# Patient Record
Sex: Male | Born: 1945 | Race: White | Hispanic: No | Marital: Married | Smoking: Never smoker
Health system: Southern US, Community
[De-identification: ages and names within clinical notes are randomized; demographics above are authoritative.]

## PROBLEM LIST (undated history)

## (undated) DIAGNOSIS — E291 Testicular hypofunction: Secondary | ICD-10-CM

## (undated) DIAGNOSIS — Z9889 Other specified postprocedural states: Secondary | ICD-10-CM

## (undated) DIAGNOSIS — E079 Disorder of thyroid, unspecified: Secondary | ICD-10-CM

## (undated) DIAGNOSIS — D839 Common variable immunodeficiency, unspecified: Principal | ICD-10-CM

## (undated) DIAGNOSIS — I10 Essential (primary) hypertension: Secondary | ICD-10-CM

## (undated) HISTORY — PX: HERNIA REPAIR: SHX51

## (undated) HISTORY — DX: Essential (primary) hypertension: I10

## (undated) HISTORY — DX: Disorder of thyroid, unspecified: E07.9

## (undated) HISTORY — PX: APPENDECTOMY: SHX54

## (undated) HISTORY — DX: Other specified postprocedural states: Z98.890

## (undated) HISTORY — DX: Common variable immunodeficiency, unspecified: D83.9

## (undated) HISTORY — PX: OTHER SURGICAL HISTORY: SHX169

## (undated) HISTORY — PX: MENISCUS REPAIR: SHX5179

## (undated) HISTORY — DX: Testicular hypofunction: E29.1

---

## 2015-08-02 ENCOUNTER — Ambulatory Visit (HOSPITAL_COMMUNITY): Payer: Medicare Other | Admitting: Psychiatry

## 2015-08-02 DIAGNOSIS — F332 Major depressive disorder, recurrent severe without psychotic features: Secondary | ICD-10-CM | POA: Insufficient documentation

## 2015-08-02 NOTE — Progress Notes (Signed)
Psychiatric Initial Adult Assessment   Patient Identification: Leonard Reid MRN:  [id number] Date of Evaluation:  08/02/2015 Referral Source: self Chief Complaint:   Visit Diagnosis:    ICD-9-CM ICD-10-CM   1. Severe recurrent major depression without psychotic features [facility]) 296.33 F33.2    Diagnosis:   Patient Active Problem List   Diagnosis Date Noted  . Severe recurrent major depression without psychotic features (HCC) [F33.2] 08/02/2015    Priority: Medium    Class: Chronic   History of Present Illness:  Leonard Reid was diagnosed with depression in 1982 but recalls being depressed since the 70's.  He has has many rounds of medication treatment with augmentation and no significant response from any.  He has been in and out of therapy for years but the depression has persisted.  Currently he feels sad, somewhat hopeless, guilty for past decisions, has always had trouble sleeping, cannot find enjoyment, does not look forward to things and feels something has to change because he cannot continue living this way.  Childhood was uneventful without known history of depression in his family.  He remembers being happy as a child.  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  His second and current wife is supportive.  In spite of a basically good life on the outside he still feels depressed.  No significant anxiety until the last few years.  No psychosis with the depression.  The depression gets better and worse on an unpredictable cycle.  He says he feels more depressed than the depression scales reveal and he presents as severely depressed. Elements:  Location:  depression. Quality:  daily sadness. Severity:  no joy in life. Timing:  many years without known precipitant. Duration:  as above. Context:  as above. Associated  Signs/Symptoms: Depression Symptoms:  depressed mood, anhedonia, insomnia, fatigue, feelings of worthlessness/guilt, difficulty concentrating, hopelessness, impaired memory, anxiety, (Hypo) Manic Symptoms:  Irritable Mood, Anxiety Symptoms:  Excessive Worry, Psychotic Symptoms:  none PTSD Symptoms: Negative  Past Medical History: No past medical history on file. No past surgical history on file. Family History: No family history on file. Social History:   Social History   Social History  . Marital Status: Married    Spouse Name: N/A  . Number of Children: N/A  . Years of Education: N/A   Social History Main Topics  . Smoking status: Not on file  . Smokeless tobacco: Not on file  . Alcohol Use: Not on file  . Drug Use: Not on file  . Sexual Activity: Not on file   Other Topics Concern  . Not on file   Social History Narrative  . No narrative on file   Additional Social History: none  Musculoskeletal: Strength & Muscle Tone: within normal limits Gait & Station: normal Patient leans: N/A  Psychiatric Specialty Exam: HPI  ROS  There were no vitals taken for this visit.There is no height or weight on file to calculate BMI.  General Appearance: Well Groomed  Eye Contact:  Good  Speech:  Clear and Coherent  Volume:  Normal  Mood:  Anxious and Depressed  Affect:  Congruent  Thought Process:  Coherent and Logical  Orientation:  Full (Time, Place, and Person)  Thought Content:  Negative  Suicidal Thoughts:  No  Homicidal Thoughts:  No  Memory:  Immediate;   Good Recent;  Good Remote;   Good  Judgement:  Good  Insight:  Good  Psychomotor Activity:  Normal  Concentration:  Good  Recall:  Good  Fund of Knowledge:Good  Language: Good  Akathisia:  Negative  Handed:  Right  AIMS (if indicated):  0  Assets:  Communication Skills Desire for Improvement Financial Resources/Insurance Housing Intimacy Leisure Time Physical Health Social  Support Talents/Skills Transportation Vocational/Educational  ADL's:  Intact  Cognition: WNL  Sleep:  Insomnia all his adult life   Is the patient at risk to self?  No. Has the patient been a risk to self in the past 6 months?  No. Has the patient been a risk to self within the distant past?  No. Is the patient a risk to others?  No. Has the patient been a risk to others in the past 6 months?  No. Has the patient been a risk to others within the distant past?  No.  Allergies:  Not on File Current Medications: No current outpatient prescriptions on file.   No current facility-administered medications for this visit.    Previous Psychotropic Medications: Yes   Substance Abuse History in the last 12 months:  No.  Consequences of Substance Abuse: Negative  Medical Decision Making:  Established Problem, Worsening (2)  Treatment Plan Summary: qualifies for TMS treatment. no history of seizures, no metal implants, not bipolar and no psychosis    Carolanne GrumblingGerald Taylor 11/30/20162:22 PM

## 2015-10-10 ENCOUNTER — Ambulatory Visit (HOSPITAL_COMMUNITY): Payer: Medicare Other | Admitting: Psychiatry

## 2015-10-26 ENCOUNTER — Encounter: Payer: Self-pay | Admitting: Hematology and Oncology

## 2015-10-26 ENCOUNTER — Other Ambulatory Visit: Payer: Self-pay | Admitting: Hematology and Oncology

## 2015-10-26 DIAGNOSIS — D839 Common variable immunodeficiency, unspecified: Secondary | ICD-10-CM

## 2015-10-26 HISTORY — DX: Common variable immunodeficiency, unspecified: D83.9

## 2015-11-13 ENCOUNTER — Encounter: Payer: Self-pay | Admitting: Hematology and Oncology

## 2015-12-04 ENCOUNTER — Telehealth: Payer: Self-pay | Admitting: Hematology and Oncology

## 2015-12-04 ENCOUNTER — Other Ambulatory Visit: Payer: Self-pay | Admitting: Hematology and Oncology

## 2015-12-04 ENCOUNTER — Telehealth: Payer: Self-pay | Admitting: *Deleted

## 2015-12-04 ENCOUNTER — Other Ambulatory Visit (HOSPITAL_BASED_OUTPATIENT_CLINIC_OR_DEPARTMENT_OTHER): Payer: Medicare Other

## 2015-12-04 ENCOUNTER — Ambulatory Visit (HOSPITAL_BASED_OUTPATIENT_CLINIC_OR_DEPARTMENT_OTHER): Payer: Medicare Other | Admitting: Hematology and Oncology

## 2015-12-04 ENCOUNTER — Encounter: Payer: Self-pay | Admitting: Hematology and Oncology

## 2015-12-04 VITALS — BP 134/66 | HR 67 | Temp 97.8°F | Resp 19 | Ht 68.0 in | Wt 178.8 lb

## 2015-12-04 DIAGNOSIS — D839 Common variable immunodeficiency, unspecified: Secondary | ICD-10-CM

## 2015-12-04 LAB — COMPREHENSIVE METABOLIC PANEL
ALBUMIN: 3.5 g/dL (ref 3.5–5.0)
ALK PHOS: 110 U/L (ref 40–150)
ALT: 44 U/L (ref 0–55)
AST: 32 U/L (ref 5–34)
Anion Gap: 6 mEq/L (ref 3–11)
BUN: 22.9 mg/dL (ref 7.0–26.0)
CALCIUM: 9.1 mg/dL (ref 8.4–10.4)
CO2: 28 mEq/L (ref 22–29)
CREATININE: 1.4 mg/dL — AB (ref 0.7–1.3)
Chloride: 109 mEq/L (ref 98–109)
EGFR: 50 mL/min/{1.73_m2} — ABNORMAL LOW (ref >90)
Glucose: 96 mg/dl (ref 70–140)
POTASSIUM: 4 meq/L (ref 3.5–5.1)
Sodium: 143 mEq/L (ref 136–145)
Total Bilirubin: 0.62 mg/dL (ref 0.20–1.20)
Total Protein: 6.8 g/dL (ref 6.4–8.3)

## 2015-12-04 LAB — CBC WITH DIFFERENTIAL/PLATELET
BASO%: 0.8 % (ref 0.0–2.0)
Basophils Absolute: 0 10*3/uL (ref 0.0–0.1)
EOS%: 3.2 % (ref 0.0–7.0)
Eosinophils Absolute: 0.1 10*3/uL (ref 0.0–0.5)
HCT: 39.6 % (ref 38.4–49.9)
HGB: 13 g/dL (ref 13.0–17.1)
LYMPH%: 27.2 % (ref 14.0–49.0)
MCH: 30.4 pg (ref 27.2–33.4)
MCHC: 32.7 g/dL (ref 32.0–36.0)
MCV: 92.9 fL (ref 79.3–98.0)
MONO#: 0.3 10*3/uL (ref 0.1–0.9)
MONO%: 7.5 % (ref 0.0–14.0)
NEUT%: 61.3 % (ref 39.0–75.0)
NEUTROS ABS: 2.2 10*3/uL (ref 1.5–6.5)
Platelets: 254 10*3/uL (ref 140–400)
RBC: 4.26 10*6/uL (ref 4.20–5.82)
RDW: 12.6 % (ref 11.0–14.6)
WBC: 3.6 10*3/uL — AB (ref 4.0–10.3)
lymph#: 1 10*3/uL (ref 0.9–3.3)

## 2015-12-04 NOTE — Telephone Encounter (Signed)
not going to be here didi not want to keep appts

## 2015-12-04 NOTE — Assessment & Plan Note (Signed)
He has been receiving IVIG treatment in [place] and since he will be in [place] transiently for the next 3 months, I will prescribe IVIG for him. He has been receiving Gammagard 40 g IV every 3 weeks without any side effects. I will prescribe the same. He will get premedication with Tylenol and Benadryl before treatment.

## 2015-12-04 NOTE — Telephone Encounter (Signed)
Pt had question about how long his infusion of IVIG will take on Friday.  He is scheduled for 4 hrs and has a 1 pm appt that day.  I checked w/ pharmacist who says the Infusion should take about 3 hrs.  I called pt and informed him of 3 hr infusion time so a 4 hr appt should be adequate.  Instructed him to arrive 15 min early to register.  He verbalized understanding.

## 2015-12-04 NOTE — Progress Notes (Signed)
[facility] CONSULT NOTE  Patient Care Team: No Pcp Per Patient as PCP - General (General Practice)  CHIEF COMPLAINTS/PURPOSE OF CONSULTATION:  CVID, on chronic IVIG treatment  HISTORY OF PRESENTING ILLNESS:  Leonard Reid 70 y.o. 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 This patient has history of severe depression and is currently receiving treatment here in [place] for the next 3 months. He is requesting continuous IVIG prescription here. He was diagnosed with CVID approximately 3 years ago. He has a lot of recurrent bronchial infection and bronchiectasis in the past. Since he was prescribed IVIG, he denies further recurrent infection. His last CT scan and pulmonary function test showed improvement of his lung function. The patient has hiatal hernia and chronic reflux and is currently taking proton pump inhibitor for mild cough. The patient has chronic upper respiratory tract infection since childhood. he denies abnormal skin rashes.  he denies intermittent abdominal pain, bloating or diarrhea. He denies infusion reaction with IVIG.  MEDICAL HISTORY:  Past Medical History  Diagnosis Date  . 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 S/P bronchoscopy   . Thyroid disease   . Hypogonadism in male   . Hypertension     SURGICAL HISTORY: Past Surgical History  Procedure Laterality Date  . Hernia repair    . Meniscus repair    . Laminectomy    . Appendectomy      SOCIAL HISTORY: Social History   Social History  . Marital Status: Married    Spouse Name: N/A  . Number of Children: N/A  . Years of Education: N/A   Occupational History  . Not on file.   Social History Main Topics  . Smoking status: Never Smoker   . Smokeless tobacco: Not on file  . Alcohol Use: 0.6 oz/week    1 Glasses of wine per week  . Drug Use: No  . Sexual Activity: Not on file   Other Topics Concern  . Not on file   Social History  Narrative    FAMILY HISTORY: Family History  Problem Relation Age of Onset  . Cancer Father     esophageal ca    ALLERGIES:  has No Known Allergies.  MEDICATIONS:  Current Outpatient Prescriptions  Medication Sig Dispense Refill  . amphetamine-dextroamphetamine (ADDERALL) 30 MG tablet Take 30 mg by mouth daily.    . Cholecalciferol (VITAMIN D3) 5000 units TABS Take 5,000 Units by mouth daily.    . ergocalciferol (VITAMIN D2) 50000 units capsule Take 50,000 Units by mouth once a week.    Marland Kitchen. ipratropium (ATROVENT HFA) 17 MCG/ACT inhaler Inhale 2 puffs into the lungs as needed for wheezing.    . levalbuterol (XOPENEX) 0.31 MG/3ML nebulizer solution Take 1 ampule by nebulization as needed for wheezing.    Marland Kitchen. MELATONIN PO Take by mouth at bedtime. 5 to 10 mg    . Nutritional Supplements (DHEA PO) Take 25 mg by mouth 2 (two) times daily.    . pantoprazole (PROTONIX) 40 MG tablet Take 40 mg by mouth daily.    . TESTOSTERONE IM Inject 200 mg into the muscle 2 (two) times daily.    Marland Kitchen. thyroid (ARMOUR) 60 MG tablet Take 60 mg by mouth daily before breakfast.    . UNABLE TO FIND Med Name: Losavtan Potassium 50 mg every evening     No current facility-administered medications for this visit.    REVIEW OF SYSTEMS:   Constitutional: Denies fevers, chills or abnormal night  sweats Eyes: Denies blurriness of vision, double vision or watery eyes Ears, nose, mouth, throat, and face: Denies mucositis or sore throat Cardiovascular: Denies palpitation, chest discomfort or lower extremity swelling Gastrointestinal:  Denies nausea, heartburn or change in bowel habits Skin: Denies abnormal skin rashes Lymphatics: Denies new lymphadenopathy or easy bruising Neurological:Denies numbness, tingling or new weaknesses Behavioral/Psych: Mood is stable, no new changes  All other systems were reviewed with the patient and are negative.  PHYSICAL EXAMINATION: ECOG PERFORMANCE STATUS: 1 - Symptomatic but  completely ambulatory  Filed Vitals:   12/04/15 1117  BP: 134/66  Pulse: 67  Temp: 97.8 F (36.6 C)  Resp: 19   Filed Weights   12/04/15 1117  Weight: 178 lb 12.8 oz (81.103 kg)    GENERAL:alert, no distress and comfortable SKIN: skin color, texture, turgor are normal, no rashes or significant lesions EYES: normal, conjunctiva are pink and non-injected, sclera clear OROPHARYNX:no exudate, no erythema and lips, buccal mucosa, and tongue normal  NECK: supple, thyroid normal size, non-tender, without nodularity LYMPH:  no palpable lymphadenopathy in the cervical, axillary or inguinal LUNGS: clear to auscultation and percussion with normal breathing effort HEART: regular rate & rhythm and no murmurs and no lower extremity edema ABDOMEN:abdomen soft, non-tender and normal bowel sounds Musculoskeletal:no cyanosis of digits and no clubbing  PSYCH: alert & oriented x 3 with fluent speech NEURO: no focal motor/sensory deficits  LABORATORY DATA:  I have reviewed the data as listed Lab Results  Component Value Date   WBC 3.6* 12/04/2015   HGB 13.0 12/04/2015   HCT 39.6 12/04/2015   MCV 92.9 12/04/2015   PLT 254 12/04/2015    Recent Labs  12/04/15 1103  NA 143  K 4.0  CO2 28  GLUCOSE 96  BUN 22.9  CREATININE 1.4*  CALCIUM 9.1  PROT 6.8  ALBUMIN 3.5  AST 32  ALT 44  ALKPHOS 110  BILITOT 0.62   ASSESSMENT & PLAN:  CVID (common variable immunodeficiency) (HCC) He has been receiving IVIG treatment in [place] and since he will be in [place] transiently for the next 3 months, I will prescribe IVIG for him. He has been receiving Gammagard 40 g IV every 3 weeks without any side effects. I will prescribe the same. He will get premedication with Tylenol and Benadryl before treatment.    All questions were answered. The patient knows to call the clinic with any problems, questions or concerns. I spent 30 minutes counseling the patient face to face. The total time  spent in the appointment was 40 minutes and more than 50% was on counseling.     [facility], Anhelica Fowers, MD 12/04/2015 12:05 PM

## 2015-12-05 ENCOUNTER — Ambulatory Visit (HOSPITAL_COMMUNITY): Payer: Medicare Other | Admitting: Psychiatry

## 2015-12-05 DIAGNOSIS — F332 Major depressive disorder, recurrent severe without psychotic features: Secondary | ICD-10-CM

## 2015-12-05 LAB — IGG, IGA, IGM
IGA/IMMUNOGLOBULIN A, SERUM: 88 mg/dL (ref 61–437)
IGM (IMMUNOGLOBIN M), SRM: 25 mg/dL (ref 20–172)
IgG, Qn, Serum: 1039 mg/dL (ref 700–1600)

## 2015-12-05 NOTE — Progress Notes (Signed)
Patient ID: Leonard Reid, male   DOB: 08/27/1946, 70 y.o.   MRN: [id number] [name] presented for mapping for initiation of TMS.  Coordinates were SOA 40 degrees, A/P 11.5 cm, coil angle 10 degrees and motor threshhold was 1.23.

## 2015-12-05 NOTE — Progress Notes (Signed)
Pt reported to [facility] for cortical mapping and motor threshold determination for Repetitive Transcranial Magnetic Stimulation treatment for Major Depressive Disorder. Pt accompanied by his wife for social support. Pt completed a PHQ-9 with a score of 27 ( severe depression). Pt also completed a Beck's Depression Inventory with a score of 40 (severe depression). Prior to procedure, pt signed an informed consent agreement for TMS treatment. Pt's treatment area was found by applying single pulses to her left motor cortex, hunting along the anterior/posterior plane and along the superior oblique angle until the best motor response was elicited from the pt's right thumb. The best response was observed at 6.0 cm A/P and 40 degrees SOA, with a coil angle of 10 degrees. Pt's motor threshold was calculated using the [employer]'s proprietary MT Assist algorithm, which produced a calculated motor threshold of 1.13 SMT. Per these findings, pt's treatment parameters are as follows: A/P -- 11.5 cm, SOA -- 40 degrees, Coil Angle -- 25degrees, Motor Threshold -- 1.23 SMT. With these parameters, the pt will receive 30 sessions of TMS according to the following protocol: 3000 pulses per session, with stimulation in bursts of pulses lasting 4 seconds at a frequency of 10 Hz, separated by 26 seconds of rest. After determining pt's tx parameters, coil was moved to the treatment location, and the first burst of pulses was applied at a reduced power of 80%MT. Pt reported no complaints, and stated that the stimulation was tolerable, so the first tx session was given to the pt. Stimulation power was gradually titrated up from 80%MT to 120%MT. Will attempt to titrate up to 120%MT at the next tx session. Pt tolerated tx well. Pt with no complaints post-tx. Pt and his wife departed from clinic without issue.

## 2015-12-06 ENCOUNTER — Other Ambulatory Visit (HOSPITAL_COMMUNITY): Payer: Medicare Other | Attending: Psychiatry | Admitting: Emergency Medicine

## 2015-12-06 DIAGNOSIS — F332 Major depressive disorder, recurrent severe without psychotic features: Secondary | ICD-10-CM | POA: Diagnosis present

## 2015-12-06 NOTE — Progress Notes (Signed)
Pt reported to [facility] for Repetitive Transcranial Magnetic Stimulation treatment for Major Depressive Disorder. Pt was accompanied by his wife for social support. Pt presented with pleasant affect. Pt reported no change in medication, alcohol/substance use, caffeine consumption, sleep pattern or metal implant status since previous tx. Pt tolerated tx well. Power titrated to 120% for the duration of tx.Pt with no complaints post-tx. Pt and wife departed from clinic without issue.

## 2015-12-07 ENCOUNTER — Encounter: Payer: Self-pay | Admitting: Hematology and Oncology

## 2015-12-07 ENCOUNTER — Other Ambulatory Visit (HOSPITAL_COMMUNITY): Payer: Medicare Other | Admitting: Emergency Medicine

## 2015-12-07 ENCOUNTER — Other Ambulatory Visit: Payer: Medicare Other

## 2015-12-07 ENCOUNTER — Other Ambulatory Visit: Payer: Self-pay | Admitting: *Deleted

## 2015-12-07 ENCOUNTER — Ambulatory Visit: Payer: Medicare Other | Admitting: Hematology and Oncology

## 2015-12-07 DIAGNOSIS — F332 Major depressive disorder, recurrent severe without psychotic features: Secondary | ICD-10-CM | POA: Diagnosis not present

## 2015-12-07 DIAGNOSIS — F900 Attention-deficit hyperactivity disorder, predominantly inattentive type: Secondary | ICD-10-CM

## 2015-12-07 MED ORDER — AMPHETAMINE-DEXTROAMPHETAMINE 30 MG PO TABS: 30.0000 mg | ORAL_TABLET | Freq: Two times a day (BID) | ORAL | Status: DC

## 2015-12-07 NOTE — Progress Notes (Signed)
Patient ID: Leonard Reid, male   DOB: Jan 12, 1946, 70 y.o.   MRN: [id number] Here for TMS but needs refill on Adderall.  Taking 30 mg daily but says he sometimes needs more in this transition from [place] to [place] as his routine varies.  Consequently will write a one month supply ao Adderall  30 mg tabs bid # 60 no additional refills.

## 2015-12-07 NOTE — Progress Notes (Signed)
[facility] for Repetitive Transcranial Magnetic Stimulation treatment for Major Depressive Disorder. Pt was accompanied by his wife for social support. Pt requested for Adderell. Dr. Ladona Ridgelaylor wrote a prescription for medication. Pt presented with pleasant affect. Pt reported no change in alcohol/substance use, caffeine consumption, sleep pattern or metal implant status since previous tx. Pt mentioned that he has been feeling tired. Pt was reassured that it is one of the symptoms during first week of treatment. Pt tolerated tx well. Power titrated to 120% for the duration of tx.Pt with no complaints post-tx. Pt and wife departed from clinic without issue.

## 2015-12-08 ENCOUNTER — Ambulatory Visit (INDEPENDENT_AMBULATORY_CARE_PROVIDER_SITE_OTHER): Payer: Medicare Other | Admitting: Emergency Medicine

## 2015-12-08 ENCOUNTER — Ambulatory Visit (HOSPITAL_BASED_OUTPATIENT_CLINIC_OR_DEPARTMENT_OTHER): Payer: Medicare Other

## 2015-12-08 ENCOUNTER — Encounter: Payer: Self-pay | Admitting: *Deleted

## 2015-12-08 VITALS — BP 125/64 | HR 63 | Temp 97.7°F | Resp 18

## 2015-12-08 DIAGNOSIS — D839 Common variable immunodeficiency, unspecified: Secondary | ICD-10-CM | POA: Diagnosis present

## 2015-12-08 DIAGNOSIS — F332 Major depressive disorder, recurrent severe without psychotic features: Secondary | ICD-10-CM | POA: Diagnosis not present

## 2015-12-08 MED ORDER — DEXTROSE 5 % IV SOLN
INTRAVENOUS | Status: DC
  Administered 2015-12-08: 09:00:00 via INTRAVENOUS

## 2015-12-08 MED ORDER — ACETAMINOPHEN 325 MG PO TABS
ORAL_TABLET | ORAL | Status: AC
  Filled 2015-12-08: qty 2

## 2015-12-08 MED ORDER — GAMMAGARD 10 GM/100ML IJ SOLN
40.0000 g | Freq: Once | INTRAMUSCULAR | Status: AC
  Administered 2015-12-08: 40 g via INTRAVENOUS
  Filled 2015-12-08: qty 400

## 2015-12-08 MED ORDER — DIPHENHYDRAMINE HCL 25 MG PO TABS
25.0000 mg | ORAL_TABLET | Freq: Once | ORAL | Status: AC
  Administered 2015-12-08: 25 mg via ORAL
  Filled 2015-12-08: qty 1

## 2015-12-08 MED ORDER — ACETAMINOPHEN 325 MG PO TABS
650.0000 mg | ORAL_TABLET | Freq: Once | ORAL | Status: AC
  Administered 2015-12-08: 650 mg via ORAL

## 2015-12-08 MED ORDER — DIPHENHYDRAMINE HCL 25 MG PO CAPS
ORAL_CAPSULE | ORAL | Status: AC
  Filled 2015-12-08: qty 1

## 2015-12-08 MED ORDER — IMMUNE GLOBULIN (HUMAN) 10 GM/100ML IV SOLN: 500.0000 mg/kg | Freq: Once | INTRAVENOUS | Status: DC

## 2015-12-08 MED ORDER — SODIUM CHLORIDE 0.9 % IV SOLN
Freq: Once | INTRAVENOUS | Status: AC
  Administered 2015-12-08: 08:00:00 via INTRAVENOUS

## 2015-12-08 NOTE — Patient Instructions (Signed)

## 2015-12-08 NOTE — Progress Notes (Signed)
[facility] Transc[facility] Stimulation treatment for Major Depressive Disorder. Pt was accompanied by his wife for social support.Pt presented with pleasant affect. Pt reported no change in alcohol/substance use, caffeine consumption, sleep pattern or metal implant status since previous tx.  Pt tolerated tx well. Power titrated to 120% for the duration of tx.Pt with no complaints post-tx. Pt and wife departed from clinic without issue.  

## 2015-12-11 ENCOUNTER — Other Ambulatory Visit (INDEPENDENT_AMBULATORY_CARE_PROVIDER_SITE_OTHER): Payer: Medicare Other | Admitting: Emergency Medicine

## 2015-12-11 DIAGNOSIS — F332 Major depressive disorder, recurrent severe without psychotic features: Secondary | ICD-10-CM | POA: Diagnosis not present

## 2015-12-11 NOTE — Progress Notes (Signed)
Pt reported to [facility] for Repetitive Transcranial Magnetic Stimulation treatment for Major Depressive Disorder. Pt was accompanied by his wife for social support.Pt presented with pleasant affect. Pt reported no change in alcohol/substance use, caffeine consumption, sleep pattern or metal implant status since previous tx. Pt stated that he is noticing hair loss. Pt tolerated tx well. Power titrated to 120% for the duration of tx.Pt with no complaints post-tx. Pt and wife departed from clinic without issue.

## 2015-12-12 ENCOUNTER — Other Ambulatory Visit (INDEPENDENT_AMBULATORY_CARE_PROVIDER_SITE_OTHER): Payer: Medicare Other | Admitting: *Deleted

## 2015-12-12 DIAGNOSIS — F332 Major depressive disorder, recurrent severe without psychotic features: Secondary | ICD-10-CM

## 2015-12-12 NOTE — Progress Notes (Cosign Needed)
Patient ID: Leonard Reid, male   DOB: October 14, 1945, 70 y.o.   MRN: [id number] Pt [facility] for Repetitive Transcranial Magnetic Stimulation treatment for Major Depressive Disorder. Pt presented with pleasant affect. Pt reported no change in alcohol/substance use, caffeine consumption, sleep pattern or metal implant status since previous tx. Pt stated that he is noticing hair loss.  Pt chatted with Clinical research associatewriter about [place] history. Pt tolerated tx well. Power titrated to 120% for the duration of tx.Pt with no complaints post-tx. Pt and wife departed from clinic without issue.

## 2015-12-15 ENCOUNTER — Encounter (HOSPITAL_COMMUNITY): Payer: Medicare Other

## 2015-12-18 ENCOUNTER — Other Ambulatory Visit (INDEPENDENT_AMBULATORY_CARE_PROVIDER_SITE_OTHER): Payer: Medicare Other | Admitting: Emergency Medicine

## 2015-12-18 DIAGNOSIS — F332 Major depressive disorder, recurrent severe without psychotic features: Secondary | ICD-10-CM

## 2015-12-18 NOTE — Progress Notes (Signed)
[facility] to [facility] for Repetitive Transcranial Magnetic Stimulation treatment for Major Depressive Disorder. Pt was accompanied by his wife for social support.Pt presented with pleasant affect. Pt reported no change in alcohol/substance use, caffeine consumption, sleep pattern or metal implant status since previous tx. Pt stated he enjoyed his trip this past weekend.. Pt tolerated tx well. Power titrated to 120% for the duration of tx.Pt with no complaints post-tx. Pt and wife departed from clinic without issue.

## 2015-12-19 ENCOUNTER — Other Ambulatory Visit (INDEPENDENT_AMBULATORY_CARE_PROVIDER_SITE_OTHER): Payer: Medicare Other | Admitting: Emergency Medicine

## 2015-12-19 DIAGNOSIS — F332 Major depressive disorder, recurrent severe without psychotic features: Secondary | ICD-10-CM | POA: Diagnosis not present

## 2015-12-19 NOTE — Progress Notes (Signed)
[facility] for Repetitive Transcranial Magnetic Stimulation treatment for Major Depressive Disorder. Pt was accompanied by his wife for social support.Pt presented with pleasant affect. Pt reported no change in alcohol/substance use, caffeine consumption, sleep pattern or metal implant status since previous tx. Pt that he experienced chattering of teeth during tx session. Pt also mentioned that he experienced slight headaches yesterday evening. Pt tolerated tx well. Power titrated to 120% for the duration of tx.Pt with no complaints post-tx. Pt and wife departed from clinic without issue.

## 2015-12-20 ENCOUNTER — Other Ambulatory Visit (INDEPENDENT_AMBULATORY_CARE_PROVIDER_SITE_OTHER): Payer: Medicare Other | Admitting: Emergency Medicine

## 2015-12-20 DIAGNOSIS — F332 Major depressive disorder, recurrent severe without psychotic features: Secondary | ICD-10-CM

## 2015-12-20 NOTE — Progress Notes (Signed)
Pt reported [facility] for Repetitive Transcranial Magnetic Stimulation treatment for Major Depressive Disorder. Pt was accompanied by his wife for social support.Pt presented with pleasant affect. Pt reported no change in alcohol/substance use, caffeine consumption, sleep pattern or metal implant status since previous tx.  Pt tolerated tx well. Power titrated to 120% for the duration of tx.Pt with no complaints post-tx. Pt and wife departed from clinic without issue.

## 2015-12-21 ENCOUNTER — Other Ambulatory Visit (INDEPENDENT_AMBULATORY_CARE_PROVIDER_SITE_OTHER): Payer: Medicare Other | Admitting: *Deleted

## 2015-12-21 DIAGNOSIS — F332 Major depressive disorder, recurrent severe without psychotic features: Secondary | ICD-10-CM | POA: Diagnosis not present

## 2015-12-21 NOTE — Progress Notes (Cosign Needed)
Patient ID: Nunzio Coryrnold [place], male   DOB: 06/08/1946, 70 y.o.   MRN: [id number] Pt [facility] for Repetitive Transcranial Magnetic Stimulation treatment for Major Depressive Disorder. Pt was accompanied by his wife for social support.Pt presented with pleasant affect. Pt reports no change in level of depression.  Reiterated to pt that it is likely too early in his treatment cycle to feel measurably improved.  Pt and [place] are headed to [place] tomorrow to participate in the [employer].  Pt reported no change in alcohol/substance use, caffeine consumption, sleep pattern or metal implant status since previous tx. Pt tolerated tx well. Power titrated to 120% for the duration of tx.Pt with no complaints post-tx. Pt and wife departed from clinic without issue.

## 2015-12-25 ENCOUNTER — Other Ambulatory Visit (INDEPENDENT_AMBULATORY_CARE_PROVIDER_SITE_OTHER): Payer: Medicare Other | Admitting: *Deleted

## 2015-12-25 DIAGNOSIS — F332 Major depressive disorder, recurrent severe without psychotic features: Secondary | ICD-10-CM | POA: Diagnosis not present

## 2015-12-25 NOTE — Progress Notes (Cosign Needed)
Patient ID: Leonard Reid, male   DOB: 1946-08-19, 70 y.o.   MRN: [id number] Pt reported to [facility] for Repetitive Transcranial Magnetic Stimulation treatment for Major Depressive Disorder. Pt was accompanied by his wife for social support.Pt presented with pleasant affect. Pt and wife participated in the [place] this past weekend. Pt is still looking forward to getting DNA tested via cheek swap to hopefully find out which medications might be most effective for him. Awaiting kit delivery to [facility].  Pt reported no change in alcohol/substance use, caffeine consumption, sleep pattern or metal implant status since previous tx. Pt tolerated tx well. Power titrated to 120% for the duration of tx.Pt with no complaints post-tx. Pt and wife departed from clinic without issue.

## 2015-12-26 ENCOUNTER — Other Ambulatory Visit (INDEPENDENT_AMBULATORY_CARE_PROVIDER_SITE_OTHER): Payer: Medicare Other | Admitting: Emergency Medicine

## 2015-12-26 DIAGNOSIS — F332 Major depressive disorder, recurrent severe without psychotic features: Secondary | ICD-10-CM

## 2015-12-26 NOTE — Progress Notes (Signed)
Pt reported to [facility] for Repetitive Transcranial Magnetic Stimulation treatment for Major Depressive Disorder. Pt was accompanied by his wife for social support.Pt presented with pleasant affect. Pt reported no change in alcohol/substance use, caffeine consumption, sleep pattern or metal implant status since previous tx. Pt brought headphones to listen to music. Pt tolerated tx well. Power titrated to 120% for the duration of tx.Pt with no complaints post-tx. Pt and wife departed from clinic without issue.

## 2015-12-27 ENCOUNTER — Other Ambulatory Visit (INDEPENDENT_AMBULATORY_CARE_PROVIDER_SITE_OTHER): Payer: Medicare Other | Admitting: Emergency Medicine

## 2015-12-27 DIAGNOSIS — F332 Major depressive disorder, recurrent severe without psychotic features: Secondary | ICD-10-CM

## 2015-12-27 NOTE — Progress Notes (Signed)
Pt [facility] to [facility] for Repetitive Transcranial Magnetic Stimulation treatment for Major Depressive Disorder. Pt was accompanied by his wife for social support.Pt presented with pleasant affect. Pt reported no change in alcohol/substance use, caffeine consumption, sleep pattern or metal implant status since previous tx. Pt read magazine during treatment. Pt tolerated tx well. Power titrated to 120% for the duration of tx.Pt with no complaints post-tx. Pt and wife departed from clinic without issue.

## 2015-12-28 ENCOUNTER — Other Ambulatory Visit (INDEPENDENT_AMBULATORY_CARE_PROVIDER_SITE_OTHER): Payer: Medicare Other | Admitting: Emergency Medicine

## 2015-12-28 DIAGNOSIS — F332 Major depressive disorder, recurrent severe without psychotic features: Secondary | ICD-10-CM | POA: Diagnosis not present

## 2015-12-28 NOTE — Progress Notes (Signed)
Pt reported to [facility] for Repetitive Transcranial Magnetic Stimulation treatment for Major Depressive Disorder. Pt was accompanied by his wife for social support.Pt presented with pleasant affect. Pt reported no change in alcohol/substance use, caffeine consumption, sleep pattern or metal implant status since previous tx. Pt listened to music on his phone. Pt tolerated tx well. Power titrated to 120% for the duration of tx.Pt with no complaints post-tx. Pt and wife departed from clinic without issue.

## 2015-12-29 ENCOUNTER — Other Ambulatory Visit: Payer: Self-pay | Admitting: Hematology and Oncology

## 2015-12-29 ENCOUNTER — Ambulatory Visit (HOSPITAL_BASED_OUTPATIENT_CLINIC_OR_DEPARTMENT_OTHER): Payer: Medicare Other

## 2015-12-29 ENCOUNTER — Other Ambulatory Visit (INDEPENDENT_AMBULATORY_CARE_PROVIDER_SITE_OTHER): Payer: Medicare Other | Admitting: Emergency Medicine

## 2015-12-29 VITALS — BP 125/61 | HR 70 | Temp 97.0°F | Resp 18

## 2015-12-29 DIAGNOSIS — D839 Common variable immunodeficiency, unspecified: Secondary | ICD-10-CM

## 2015-12-29 DIAGNOSIS — F332 Major depressive disorder, recurrent severe without psychotic features: Secondary | ICD-10-CM

## 2015-12-29 MED ORDER — DIPHENHYDRAMINE HCL 25 MG PO CAPS
25.0000 mg | ORAL_CAPSULE | Freq: Once | ORAL | Status: AC
  Administered 2015-12-29: 25 mg via ORAL

## 2015-12-29 MED ORDER — ALTEPLASE 2 MG IJ SOLR
2.0000 mg | Freq: Once | INTRAMUSCULAR | Status: DC | PRN
  Filled 2015-12-29: qty 2

## 2015-12-29 MED ORDER — IMMUNE GLOBULIN (HUMAN) 10 GM/100ML IV SOLN: 0.5000 g/kg | Freq: Once | INTRAVENOUS | Status: DC

## 2015-12-29 MED ORDER — SODIUM CHLORIDE 0.9 % IJ SOLN
3.0000 mL | Freq: Once | INTRAMUSCULAR | Status: DC | PRN
  Filled 2015-12-29: qty 10

## 2015-12-29 MED ORDER — SODIUM CHLORIDE 0.9 % IV SOLN
INTRAVENOUS | Status: DC
  Administered 2015-12-29: 09:00:00 via INTRAVENOUS

## 2015-12-29 MED ORDER — ACETAMINOPHEN 325 MG PO TABS
ORAL_TABLET | ORAL | Status: AC
  Filled 2015-12-29: qty 2

## 2015-12-29 MED ORDER — ACETAMINOPHEN 325 MG PO TABS
650.0000 mg | ORAL_TABLET | Freq: Once | ORAL | Status: AC
  Administered 2015-12-29: 650 mg via ORAL

## 2015-12-29 MED ORDER — SODIUM CHLORIDE 0.9 % IJ SOLN
10.0000 mL | INTRAMUSCULAR | Status: DC | PRN
  Filled 2015-12-29: qty 10

## 2015-12-29 MED ORDER — HEPARIN SOD (PORK) LOCK FLUSH 100 UNIT/ML IV SOLN
500.0000 [IU] | Freq: Once | INTRAVENOUS | Status: DC | PRN
  Filled 2015-12-29: qty 5

## 2015-12-29 MED ORDER — GAMMAGARD 10 GM/100ML IJ SOLN
40.0000 g | Freq: Once | INTRAMUSCULAR | Status: AC
  Administered 2015-12-29: 40 g via INTRAVENOUS
  Filled 2015-12-29: qty 400

## 2015-12-29 MED ORDER — HEPARIN SOD (PORK) LOCK FLUSH 100 UNIT/ML IV SOLN
250.0000 [IU] | Freq: Once | INTRAVENOUS | Status: DC | PRN
  Filled 2015-12-29: qty 5

## 2015-12-29 MED ORDER — DEXTROSE 5 % IV SOLN
INTRAVENOUS | Status: DC
Start: 2015-12-29 — End: 2015-12-29
  Administered 2015-12-29: 10:00:00 via INTRAVENOUS

## 2015-12-29 MED ORDER — DIPHENHYDRAMINE HCL 25 MG PO CAPS
ORAL_CAPSULE | ORAL | Status: AC
  Filled 2015-12-29: qty 1

## 2015-12-29 NOTE — Progress Notes (Signed)
Pt reported to [facility] for Repetitive Transcranial Magnetic Stimulation treatment for Major Depressive Disorder. Pt was accompanied by his wife for social support.Pt presented with pleasant affect. Pt rushed to tx session from getting [facility] this [facility]. Pt reported no change in alcohol/substance use, caffeine consumption, sleep pattern or metal implant status since previous tx. Pt listened to music on his phone. Pt tolerated tx well. Power titrated to 120% for the duration of tx.Pt with no complaints post-tx. Pt and wife departed from clinic without issue.

## 2015-12-29 NOTE — Patient Instructions (Signed)

## 2016-01-01 ENCOUNTER — Other Ambulatory Visit (HOSPITAL_COMMUNITY): Payer: Medicare Other | Attending: Psychiatry | Admitting: Emergency Medicine

## 2016-01-01 DIAGNOSIS — F332 Major depressive disorder, recurrent severe without psychotic features: Secondary | ICD-10-CM

## 2016-01-01 NOTE — Progress Notes (Signed)
[facility] for Repetitive Transcranial Magnetic Stimulation treatment for Major Depressive Disorder. Pt was accompanied by his wife for social support.Pt presented with pleasant affect. Pt rushed to tx session from getting a fusion this [facility]. Pt reported no change in alcohol/substance use, caffeine consumption, sleep pattern or metal implant status since previous tx. Pt watched TV. Pt tolerated tx well. Power titrated to 120% for the duration of tx.Pt with no complaints post-tx. Pt and wife departed from clinic without issue.

## 2016-01-02 ENCOUNTER — Other Ambulatory Visit (INDEPENDENT_AMBULATORY_CARE_PROVIDER_SITE_OTHER): Payer: Medicare Other | Admitting: Emergency Medicine

## 2016-01-02 DIAGNOSIS — F332 Major depressive disorder, recurrent severe without psychotic features: Secondary | ICD-10-CM

## 2016-01-02 NOTE — Progress Notes (Signed)
Pt reported to [facility] for Repetitive Transcranial Magnetic Stimulation treatment for Major Depressive Disorder. Pt was accompanied by his wife for social support.Pt presented with pleasant affect. Pt rushed to tx session from getting a fusion this morning at [place]. Pt reported no change in alcohol/substance use, caffeine consumption, sleep pattern or metal implant status since previous tx. Pt brought book to read and talked to his wife. Pt tolerated tx well. Power titrated to 120% for the duration of tx.Pt with no complaints post-tx. Pt and wife departed from clinic without issue.  

## 2016-01-03 ENCOUNTER — Other Ambulatory Visit (INDEPENDENT_AMBULATORY_CARE_PROVIDER_SITE_OTHER): Payer: Medicare Other | Admitting: Emergency Medicine

## 2016-01-03 ENCOUNTER — Encounter: Payer: Self-pay | Admitting: Hematology and Oncology

## 2016-01-03 DIAGNOSIS — F332 Major depressive disorder, recurrent severe without psychotic features: Secondary | ICD-10-CM | POA: Diagnosis not present

## 2016-01-03 NOTE — Progress Notes (Signed)
Pt reported to [facility] for Repetitive Transcranial Magnetic Stimulation treatment for Major Depressive Disorder. Pt was accompanied by his wife for social support.Pt presented with pleasant affect. Pt rushed to tx session from getting a fusion this morning at [place]. Pt reported no change in alcohol/substance use, caffeine consumption, sleep pattern or metal implant status since previous tx. Pt brought book to read and talked to his wife. Pt tolerated tx well. Power titrated to 120% for the duration of tx.Pt with no complaints post-tx. Pt and wife departed from clinic without issue.  

## 2016-01-04 ENCOUNTER — Other Ambulatory Visit (INDEPENDENT_AMBULATORY_CARE_PROVIDER_SITE_OTHER): Payer: Medicare Other | Admitting: Emergency Medicine

## 2016-01-04 DIAGNOSIS — F332 Major depressive disorder, recurrent severe without psychotic features: Secondary | ICD-10-CM | POA: Diagnosis not present

## 2016-01-04 NOTE — Progress Notes (Signed)
Expand All Collapse All   Pt reported to [facility] for Repetitive Transcranial Magnetic Stimulation treatment for Major Depressive Disorder. Pt was accompanied by his wife for social support.Pt presented with pleasant affect. Pt rushed to tx session from getting [facility]. Pt reported no change in alcohol/substance use, caffeine consumption, sleep pattern or metal implant status since previous tx. Pt brought book to read and talked to his wife. Pt tolerated tx well. Power titrated to 120% for the duration of tx.Pt with no complaints post-tx. Pt and wife departed from clinic without issue.

## 2016-01-05 ENCOUNTER — Other Ambulatory Visit (INDEPENDENT_AMBULATORY_CARE_PROVIDER_SITE_OTHER): Payer: Medicare Other | Admitting: *Deleted

## 2016-01-05 DIAGNOSIS — F332 Major depressive disorder, recurrent severe without psychotic features: Secondary | ICD-10-CM | POA: Diagnosis not present

## 2016-01-05 NOTE — Progress Notes (Cosign Needed Addendum)
Patient ID: Leonard Reid, male   DOB: 11/16/1945, 70 y.o.   MRN: [id number] Pt reported to [facility] for Repetitive Transcranial Magnetic Stimulation treatment for Major Depressive Disorder. Pt was accompanied by his wife for social support.Pt presented with pleasant affect. Pt reported no significant changes to mood. Pt reported no change in alcohol/substance use, caffeine consumption, sleep pattern or metal implant status since previous tx. Pt brought book to read and talked to his wife. Pt tolerated tx well. Power titrated to 120% for the duration of tx.Pt with no complaints post-tx. Pt and wife departed from clinic without issue. 

## 2016-01-05 NOTE — Addendum Note (Signed)
Addended by: Thurman CoyerKAPLAN, Mikeala Girdler S on: 01/05/2016 10:33 AM   Modules accepted: Level of Service

## 2016-01-08 ENCOUNTER — Other Ambulatory Visit (INDEPENDENT_AMBULATORY_CARE_PROVIDER_SITE_OTHER): Payer: Medicare Other | Admitting: *Deleted

## 2016-01-08 DIAGNOSIS — F332 Major depressive disorder, recurrent severe without psychotic features: Secondary | ICD-10-CM | POA: Diagnosis not present

## 2016-01-08 NOTE — Progress Notes (Signed)
Patient ID: Leonard Reid, male   DOB: 07/04/1946, 70 y.o.   MRN: [id number] Pt [facility] for Repetitive Transcranial Magnetic Stimulation treatment for Major Depressive Disorder. Pt was accompanied by his wife for social support.Pt presented with pleasant affect. Pt reported no significant changes to mood. Pt reported no change in alcohol/substance use, caffeine consumption, sleep pattern or metal implant status since previous tx. Pt brought book to read and talked to his wife. Pt tolerated tx well. Power titrated to 120% for the duration of tx.Pt with no complaints post-tx. Pt and wife departed from clinic without issue.

## 2016-01-09 ENCOUNTER — Other Ambulatory Visit (INDEPENDENT_AMBULATORY_CARE_PROVIDER_SITE_OTHER): Payer: Medicare Other | Admitting: Emergency Medicine

## 2016-01-09 DIAGNOSIS — F332 Major depressive disorder, recurrent severe without psychotic features: Secondary | ICD-10-CM

## 2016-01-09 NOTE — Progress Notes (Signed)
Pt reported to [facility] for Repetitive Transcranial Magnetic Stimulation treatment for Major Depressive Disorder. Pt was accompanied by his wife for social support.Pt presented with pleasant affect. Pt rushed to tx session from getting a fusion this morning at [place]. Pt reported no change in alcohol/substance use, caffeine consumption, sleep pattern or metal implant status since previous tx. Pt brought book to read and talked to his wife. Pt tolerated tx well. Power titrated to 120% for the duration of tx.Pt with no complaints post-tx. Pt and wife departed from clinic without issue.  

## 2016-01-10 ENCOUNTER — Other Ambulatory Visit (INDEPENDENT_AMBULATORY_CARE_PROVIDER_SITE_OTHER): Payer: Medicare Other | Admitting: Emergency Medicine

## 2016-01-10 DIAGNOSIS — F332 Major depressive disorder, recurrent severe without psychotic features: Secondary | ICD-10-CM

## 2016-01-10 NOTE — Progress Notes (Signed)
Pt [facility] for Repetitive Transcranial Magnetic Stimulation treatment for Major Depressive Disorder. Pt was accompanied by his wife for social support.Pt presented with pleasant affect. Pt rushed to tx session from getting a [facility] this [facility]. Pt reported no change in alcohol/substance use, caffeine consumption, sleep pattern or metal implant status since previous tx. Pt brought book to read and talked to his wife. Pt tolerated tx well. Power titrated to 120% for the duration of tx.Pt with no complaints post-tx. Pt and wife departed from clinic without issue.

## 2016-01-11 ENCOUNTER — Other Ambulatory Visit (INDEPENDENT_AMBULATORY_CARE_PROVIDER_SITE_OTHER): Payer: Medicare Other | Admitting: Emergency Medicine

## 2016-01-11 DIAGNOSIS — F332 Major depressive disorder, recurrent severe without psychotic features: Secondary | ICD-10-CM

## 2016-01-11 NOTE — Progress Notes (Signed)
Pt reported to [facility] for Repetitive Transcranial Magnetic Stimulation treatment for Major Depressive Disorder. Pt was accompanied by his wife for social support.Pt presented with pleasant affect. Pt rushed to tx session from getting a fusion this morning at [place]. Pt reported no change in alcohol/substance use, caffeine consumption, sleep pattern or metal implant status since previous tx. Pt brought book to read and talked to his wife. Pt tolerated tx well. Power titrated to 120% for the duration of tx.Pt with no complaints post-tx. Pt and wife departed from clinic without issue.  

## 2016-01-12 ENCOUNTER — Other Ambulatory Visit (INDEPENDENT_AMBULATORY_CARE_PROVIDER_SITE_OTHER): Payer: Medicare Other | Admitting: Emergency Medicine

## 2016-01-12 DIAGNOSIS — F332 Major depressive disorder, recurrent severe without psychotic features: Secondary | ICD-10-CM | POA: Diagnosis not present

## 2016-01-12 NOTE — Progress Notes (Signed)
Patient ID: Leonard Reid, male   DOB: 01/27/1946, 70 y.o.   MRN: [id number] Pt reported to [facility] for Repetitive Transcranial Magnetic Stimulation treatment for Major Depressive Disorder. Pt was accompanied by his wife for social support.Pt presented with pleasant affect. Pt reported no change in alcohol/substance use, caffeine consumption, sleep pattern or metal implant status since previous tx. Pt brought iPad to read and play games. Pt tolerated tx well. Power titrated to 120% for the duration of tx.Pt with no complaints post-tx. Pt completed the PHQ-9, totaling a score of 21. Pt and wife departed from clinic without issue.

## 2016-01-15 ENCOUNTER — Telehealth: Payer: Self-pay | Admitting: Hematology and Oncology

## 2016-01-15 ENCOUNTER — Other Ambulatory Visit (INDEPENDENT_AMBULATORY_CARE_PROVIDER_SITE_OTHER): Payer: Medicare Other | Admitting: Emergency Medicine

## 2016-01-15 DIAGNOSIS — F332 Major depressive disorder, recurrent severe without psychotic features: Secondary | ICD-10-CM | POA: Diagnosis not present

## 2016-01-15 NOTE — Progress Notes (Signed)
[facility] for Repetitive Transcranial Magnetic Stimulation treatment for Major Depressive Disorder. Pt was accompanied with sister. Pt stayed in lobby. His [place] in [place]. Pt presented with pleasant affect. Pt reported no change in alcohol/substance use, caffeine consumption, sleep pattern or metal implant status since previous tx. Pt meditated. Pt tolerated tx well. Power titrated to 120% for the duration of tx.Pt with no complaints post-tx. Pt and sister departed from clinic without issue.

## 2016-01-15 NOTE — Telephone Encounter (Signed)
pt called to r/s appt...done....pt ok and aware of new d.t °

## 2016-01-16 ENCOUNTER — Other Ambulatory Visit (INDEPENDENT_AMBULATORY_CARE_PROVIDER_SITE_OTHER): Payer: Medicare Other | Admitting: Psychiatry

## 2016-01-16 DIAGNOSIS — F332 Major depressive disorder, recurrent severe without psychotic features: Secondary | ICD-10-CM | POA: Diagnosis not present

## 2016-01-16 MED ORDER — LISDEXAMFETAMINE DIMESYLATE 70 MG PO CAPS: 70.0000 mg | ORAL_CAPSULE | Freq: Every day | ORAL | Status: DC

## 2016-01-16 NOTE — Addendum Note (Signed)
Addended by: Carolanne GrumblingAYLOR, GERALD D on: 01/16/2016 01:26 PM   Modules accepted: Orders

## 2016-01-16 NOTE — Progress Notes (Signed)
Patient ID: Leonard Reid, male   DOB: 12/03/1945, 70 y.o.   MRN: [id number] [name] says the TMS is not helping.  Does not want to commit to another round.  The Adderall 30 mg bid is not helping.  Cut it in half he said and cannot tell any difference with or without it.   Plan:  Recommend ECT as next step.  He is reluctant because of the memory issues and general wariness.  In the meantime will try Vyvanse 70 mg as it can sometimes help where Adderall does not.

## 2016-01-16 NOTE — Progress Notes (Signed)
[facility] for Repetitive Transcranial Magnetic Stimulation treatment for Major Depressive Disorder. Pt came alone today. His wife is in [place]. Pt presented with pleasant affect. Pt reported no change in alcohol/substance use, caffeine consumption, sleep pattern or metal implant status since previous tx. Pt meditated. Pt tolerated tx well. Power titrated to 120% for the duration of tx.Pt with no complaints post-tx. Pt r departed from clinic without issue.

## 2016-01-17 ENCOUNTER — Other Ambulatory Visit (INDEPENDENT_AMBULATORY_CARE_PROVIDER_SITE_OTHER): Payer: Medicare Other | Admitting: Emergency Medicine

## 2016-01-17 DIAGNOSIS — F332 Major depressive disorder, recurrent severe without psychotic features: Secondary | ICD-10-CM

## 2016-01-17 NOTE — Progress Notes (Signed)
[facility] for Repetitive Transcranial Magnetic Stimulation treatment for Major Depressive Disorder. Pt stayed in lobby. His wife is in [place] and did not accompany him during this visit. Pt presented with pleasant affect. Pt reported no change in alcohol/substance use, caffeine consumption, sleep pattern or metal implant status since previous tx. Pt read a book on his iPad. Pt tolerated tx well. Power titrated to 120% for the duration of tx.Pt with no complaints post-tx. Pt and sister departed from clinic without issue.

## 2016-01-18 ENCOUNTER — Other Ambulatory Visit (INDEPENDENT_AMBULATORY_CARE_PROVIDER_SITE_OTHER): Payer: Medicare Other | Admitting: Emergency Medicine

## 2016-01-18 DIAGNOSIS — F332 Major depressive disorder, recurrent severe without psychotic features: Secondary | ICD-10-CM | POA: Diagnosis not present

## 2016-01-18 NOTE — Progress Notes (Signed)
[facility] for Repetitive Transcranial Magnetic Stimulation treatment for Major Depressive Disorder. Pt stayed in lobby. His wife is in [place] and did not accompany him during this visit. Pt presented with pleasant affect. Pt reported no change in alcohol/substance use, caffeine consumption, sleep pattern or metal implant status since previous tx. Pt watched TV. Pt completed PHQ-9 and scored a 23.Pt tolerated tx well. Power titrated to 120% for the duration of tx.Pt with no complaints post-tx. Pt and sister departed from clinic without issue.

## 2016-01-19 ENCOUNTER — Ambulatory Visit (HOSPITAL_BASED_OUTPATIENT_CLINIC_OR_DEPARTMENT_OTHER): Payer: Medicare Other

## 2016-01-19 ENCOUNTER — Other Ambulatory Visit (INDEPENDENT_AMBULATORY_CARE_PROVIDER_SITE_OTHER): Payer: Medicare Other | Admitting: *Deleted

## 2016-01-19 VITALS — BP 144/66 | HR 55 | Temp 98.1°F | Resp 17

## 2016-01-19 DIAGNOSIS — F332 Major depressive disorder, recurrent severe without psychotic features: Secondary | ICD-10-CM | POA: Diagnosis not present

## 2016-01-19 DIAGNOSIS — D839 Common variable immunodeficiency, unspecified: Secondary | ICD-10-CM

## 2016-01-19 MED ORDER — IMMUNE GLOBULIN (HUMAN) 10 GM/100ML IV SOLN: 0.5000 g/kg | Freq: Once | INTRAVENOUS | Status: DC

## 2016-01-19 MED ORDER — DIPHENHYDRAMINE HCL 25 MG PO CAPS
25.0000 mg | ORAL_CAPSULE | Freq: Once | ORAL | Status: AC
  Administered 2016-01-19: 25 mg via ORAL

## 2016-01-19 MED ORDER — DIPHENHYDRAMINE HCL 25 MG PO CAPS
ORAL_CAPSULE | ORAL | Status: AC
  Filled 2016-01-19: qty 1

## 2016-01-19 MED ORDER — ACETAMINOPHEN 325 MG PO TABS
650.0000 mg | ORAL_TABLET | Freq: Once | ORAL | Status: AC
  Administered 2016-01-19: 650 mg via ORAL

## 2016-01-19 MED ORDER — IMMUNE GLOBULIN (HUMAN) 20 GM/200ML IV SOLN
40.0000 g | Freq: Once | INTRAVENOUS | Status: AC
  Administered 2016-01-19: 40 g via INTRAVENOUS
  Filled 2016-01-19: qty 400

## 2016-01-19 MED ORDER — ACETAMINOPHEN 325 MG PO TABS
ORAL_TABLET | ORAL | Status: AC
  Filled 2016-01-19: qty 2

## 2016-01-19 NOTE — Patient Instructions (Signed)

## 2016-01-19 NOTE — Progress Notes (Signed)
Patient ID: Leonard Reid, male   DOB: May 25, 1946, 70 y.o.   MRN: [id number] Pt [facility] for Repetitive Transcranial Magnetic Stimulation treatment for Major Depressive Disorder. Pt's friend stayed in lobby. His wife is in [place] and did not accompany him during this visit. Pt presented with pleasant affect. Pt reported no change in alcohol/substance use, caffeine consumption, sleep pattern or metal implant status since previous tx. Pt listened to an audio box during his treatment.Pt tolerated tx well. Power titrated to 120% for the duration of tx.Pt with no complaints post-tx. Pt and friend departed from clinic without issue.

## 2016-01-22 ENCOUNTER — Encounter (HOSPITAL_COMMUNITY): Payer: Medicare Other

## 2016-01-23 ENCOUNTER — Telehealth: Payer: Self-pay | Admitting: *Deleted

## 2016-01-23 ENCOUNTER — Other Ambulatory Visit (INDEPENDENT_AMBULATORY_CARE_PROVIDER_SITE_OTHER): Payer: Medicare Other | Admitting: Emergency Medicine

## 2016-01-23 DIAGNOSIS — F332 Major depressive disorder, recurrent severe without psychotic features: Secondary | ICD-10-CM

## 2016-01-23 NOTE — Progress Notes (Signed)
Pt reported to [facility] for Repetitive Transcranial Magnetic Stimulation treatment for Major Depressive Disorder. Pt stayed in lobby. His wife is in [place] and did not accompany him during this visit. Pt presented with pleasant affect. Pt reported no change in alcohol/substance use, caffeine consumption, sleep pattern or metal implant status since previous tx. Pt watched TV. Pt tolerated tx well. Power titrated to 120% for the duration of tx.Pt with no complaints post-tx. Pt and sister departed from clinic without issue.

## 2016-01-23 NOTE — Telephone Encounter (Signed)
Per patient request I have moved appt from 6/13 to 6/12. Patient aware

## 2016-01-24 ENCOUNTER — Other Ambulatory Visit (INDEPENDENT_AMBULATORY_CARE_PROVIDER_SITE_OTHER): Payer: Medicare Other | Admitting: Emergency Medicine

## 2016-01-24 DIAGNOSIS — F332 Major depressive disorder, recurrent severe without psychotic features: Secondary | ICD-10-CM | POA: Diagnosis not present

## 2016-01-24 NOTE — Progress Notes (Signed)
[facility] for Repetitive Transcranial Magnetic Stimulation treatment for Major Depressive Disorder. Pt presented with pleasant affect. Pt reported no change in alcohol/substance use, caffeine consumption, sleep pattern or metal implant status since previous tx. Pt watched TV. Pt tolerated tx well. Power titrated to 120% for the duration of tx.Pt with no complaints post-tx. Pt eparted from clinic without issue.

## 2016-01-25 ENCOUNTER — Other Ambulatory Visit (INDEPENDENT_AMBULATORY_CARE_PROVIDER_SITE_OTHER): Payer: Medicare Other

## 2016-01-25 DIAGNOSIS — F332 Major depressive disorder, recurrent severe without psychotic features: Secondary | ICD-10-CM | POA: Diagnosis not present

## 2016-01-25 NOTE — Progress Notes (Signed)
Patient ID: Leonard Reid, male   DOB: 19-Sep-1945, 70 y.o.   MRN: [id number] [facility] for Repetitive Transcranial Magnetic Stimulation treatment for Major Depressive Disorder. Pt presented with pleasant affect. Pt reported no change in alcohol/substance use, caffeine consumption, sleep pattern or metal implant status since previous tx. Pt watched TV on his iPad. Pt tolerated tx well. Power titrated to 120% for the duration of tx.Pt with no complaints post-tx. Pt eparted from clinic without issue.

## 2016-01-29 ENCOUNTER — Encounter (HOSPITAL_COMMUNITY): Payer: Medicare Other

## 2016-01-30 ENCOUNTER — Other Ambulatory Visit (INDEPENDENT_AMBULATORY_CARE_PROVIDER_SITE_OTHER): Payer: Medicare Other | Admitting: Emergency Medicine

## 2016-01-30 DIAGNOSIS — F332 Major depressive disorder, recurrent severe without psychotic features: Secondary | ICD-10-CM

## 2016-01-30 NOTE — Progress Notes (Signed)
[facility] for Repetitive Transcranial Magnetic Stimulation treatment for Major Depressive Disorder. Pt presented with pleasant affect. Pt reported no change in alcohol/substance use, caffeine consumption, sleep pattern or metal implant status since previous tx. Pt watched TV. Pt tolerated tx well. Power titrated to 120% for the duration of tx.Pt with no complaints post-tx. Pt eparted from clinic without issue.

## 2016-01-31 ENCOUNTER — Other Ambulatory Visit (INDEPENDENT_AMBULATORY_CARE_PROVIDER_SITE_OTHER): Payer: Medicare Other | Admitting: Emergency Medicine

## 2016-01-31 DIAGNOSIS — F332 Major depressive disorder, recurrent severe without psychotic features: Secondary | ICD-10-CM

## 2016-01-31 NOTE — Progress Notes (Signed)
[facility] for Repetitive Transcranial Magnetic Stimulation treatment for Major Depressive Disorder. Pt presented with pleasant affect. Pt reported no change in alcohol/substance use, caffeine consumption, sleep pattern or metal implant status since previous tx. Pt watched TV. Pt completed PHQ-9 questionnaire and scored a 25 - pt states that he is not experiencing any benefits from this treatment. Pt tolerated tx well. Power titrated to 120% for the duration of tx.Pt with no complaints post-tx. Pt eparted from clinic without issue.

## 2016-02-05 ENCOUNTER — Other Ambulatory Visit (HOSPITAL_COMMUNITY): Payer: Medicare Other | Attending: Psychiatry | Admitting: Emergency Medicine

## 2016-02-05 DIAGNOSIS — F332 Major depressive disorder, recurrent severe without psychotic features: Secondary | ICD-10-CM | POA: Insufficient documentation

## 2016-02-05 NOTE — Progress Notes (Signed)
[facility] for Repetitive Transcranial Magnetic Stimulation treatment for Major Depressive Disorder. Pt presented with pleasant affect. Pt reported no change in alcohol/substance use, caffeine consumption, sleep pattern or metal implant status since previous tx. Pt watched TV. Pt completed PHQ-9 questionnaire and scored a 26  - pt states that he is not experiencing any benefits from this treatment. Pt will be leaving for [place] and will be doing the 30 day follow up via phone with Dr. Ladona Ridgelaylor. Pt tolerated tx well. Power titrated to 120% for the duration of tx.Pt with no complaints post-tx. Pt eparted from clinic without issue.

## 2016-02-08 ENCOUNTER — Telehealth (HOSPITAL_COMMUNITY): Payer: Self-pay | Admitting: Psychiatry

## 2016-02-08 MED ORDER — LISDEXAMFETAMINE DIMESYLATE 70 MG PO CAPS: 70.0000 mg | ORAL_CAPSULE | Freq: Every day | ORAL | Status: DC

## 2016-02-08 NOTE — Progress Notes (Signed)
Refilled vyvanse 70 mg daily # 30

## 2016-02-09 ENCOUNTER — Telehealth (HOSPITAL_COMMUNITY): Payer: Self-pay | Admitting: Psychiatry

## 2016-02-09 ENCOUNTER — Ambulatory Visit: Payer: Medicare Other

## 2016-02-09 MED ORDER — AMPHETAMINE-DEXTROAMPHETAMINE 30 MG PO TABS: 30.0000 mg | ORAL_TABLET | Freq: Two times a day (BID) | ORAL | Status: DC

## 2016-02-12 ENCOUNTER — Ambulatory Visit (HOSPITAL_BASED_OUTPATIENT_CLINIC_OR_DEPARTMENT_OTHER): Payer: Medicare Other

## 2016-02-12 VITALS — BP 134/69 | HR 63 | Temp 98.1°F | Resp 18

## 2016-02-12 DIAGNOSIS — D839 Common variable immunodeficiency, unspecified: Secondary | ICD-10-CM

## 2016-02-12 MED ORDER — ACETAMINOPHEN 325 MG PO TABS
ORAL_TABLET | ORAL | Status: AC
  Filled 2016-02-12: qty 2

## 2016-02-12 MED ORDER — GAMMAGARD 10 GM/100ML IJ SOLN
40.0000 g | Freq: Once | INTRAMUSCULAR | Status: AC
  Administered 2016-02-12: 40 g via INTRAVENOUS
  Filled 2016-02-12: qty 400

## 2016-02-12 MED ORDER — DIPHENHYDRAMINE HCL 25 MG PO CAPS
ORAL_CAPSULE | ORAL | Status: AC
  Filled 2016-02-12: qty 1

## 2016-02-12 MED ORDER — ACETAMINOPHEN 325 MG PO TABS
650.0000 mg | ORAL_TABLET | Freq: Once | ORAL | Status: AC
  Administered 2016-02-12: 650 mg via ORAL

## 2016-02-12 MED ORDER — IMMUNE GLOBULIN (HUMAN) 10 GM/100ML IV SOLN: 0.5000 g/kg | Freq: Once | INTRAVENOUS | Status: DC

## 2016-02-12 MED ORDER — DIPHENHYDRAMINE HCL 25 MG PO CAPS
25.0000 mg | ORAL_CAPSULE | Freq: Once | ORAL | Status: AC
  Administered 2016-02-12: 25 mg via ORAL

## 2016-02-12 NOTE — Progress Notes (Signed)
Patient does not stay for 30 minute post observation.

## 2016-02-13 ENCOUNTER — Ambulatory Visit: Payer: Medicare Other

## 2016-03-01 ENCOUNTER — Ambulatory Visit: Payer: Medicare Other

## 2016-03-22 ENCOUNTER — Ambulatory Visit: Payer: Medicare Other

## 2016-03-28 ENCOUNTER — Telehealth (HOSPITAL_COMMUNITY): Payer: Self-pay | Admitting: Psychiatry

## 2016-03-28 NOTE — Progress Notes (Signed)
TMS completion follow up  Leonard Reid says the TMS course of treatment did not improve his depression if anything his depression scores were worse on completing the treatment.    he was reminded that ECT would be the next option if he chooses to pursue that.

## 2016-05-02 ENCOUNTER — Telehealth: Payer: Self-pay | Admitting: *Deleted

## 2016-05-02 NOTE — Telephone Encounter (Signed)
"  I'm a patient of Dr.Gorsuch.  I will be returning to [place], [place] 6th through July 08, 2016 and need to know what to do to set up appointments.  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  Received my last treatment a day or two ago.  I won't die if I'm off schedule a day or so and can be reached at [phone] with any questions and appointment information."

## 2016-05-02 NOTE — Telephone Encounter (Signed)
I see the infusion room is full Cameo, can you call to ask what day next week would be the best day to resume Rx?

## 2016-05-03 ENCOUNTER — Telehealth: Payer: Self-pay | Admitting: *Deleted

## 2016-05-03 NOTE — Telephone Encounter (Signed)
Pt's message said he received treatment w/ IVIG a few days ago and gets them every 3 weeks.  He will be back in town on 9/6 thru 11/6.   Looks like he isn't due for treatment again until 9/20 and infusion room has low census that day.

## 2016-05-07 ENCOUNTER — Other Ambulatory Visit: Payer: Self-pay | Admitting: Hematology and Oncology

## 2016-05-07 NOTE — Telephone Encounter (Signed)
Leonard Reid, please send scheduling msg for IVIG q 3 weeks starting 9/20 until 11/6 I do not need to see him Labs appt prior to each visit

## 2016-05-08 ENCOUNTER — Telehealth: Payer: Self-pay | Admitting: *Deleted

## 2016-05-08 ENCOUNTER — Telehealth: Payer: Self-pay | Admitting: Hematology and Oncology

## 2016-05-08 NOTE — Telephone Encounter (Signed)
LVM for pt informing him of order sent to scheduling to schedule him for lab/ IVIG starting on 9/20 and every 3 weeks until 11/6.  Expect call from Scheduling.  Call nurse back if those dates are incorrect for his treatment.

## 2016-05-08 NOTE — Telephone Encounter (Signed)
Returned pt's call and he is now in contact with Dr. Bertis RuddyGorsuch nurse and they are coordinating his care during his visit.

## 2016-05-10 ENCOUNTER — Telehealth: Payer: Self-pay | Admitting: Hematology and Oncology

## 2016-05-10 NOTE — Telephone Encounter (Signed)
lvm to inform pt of 9/20 appt date/time per LOS

## 2016-05-16 ENCOUNTER — Telehealth (HOSPITAL_COMMUNITY): Payer: Self-pay | Admitting: Psychiatry

## 2016-05-16 MED ORDER — LISDEXAMFETAMINE DIMESYLATE 70 MG PO CAPS: 70.0000 mg | ORAL_CAPSULE | Freq: Every day | ORAL | 0 refills | Status: AC

## 2016-05-16 MED ORDER — LISDEXAMFETAMINE DIMESYLATE 70 MG PO CAPS: 70.0000 mg | ORAL_CAPSULE | Freq: Every day | ORAL | 0 refills | Status: DC

## 2016-05-16 NOTE — Progress Notes (Signed)
[name] will be in town for about 2 months.  His doctor in [place] wrote refills for the Vyvanse but he cannot fill them in [place].  I will continue to write them for him until he finds a new doctor in [place].

## 2016-05-22 ENCOUNTER — Other Ambulatory Visit: Payer: Self-pay | Admitting: Hematology and Oncology

## 2016-05-22 ENCOUNTER — Ambulatory Visit (HOSPITAL_BASED_OUTPATIENT_CLINIC_OR_DEPARTMENT_OTHER): Payer: Medicare Other

## 2016-05-22 ENCOUNTER — Other Ambulatory Visit (HOSPITAL_BASED_OUTPATIENT_CLINIC_OR_DEPARTMENT_OTHER): Payer: Medicare Other

## 2016-05-22 VITALS — BP 122/58 | HR 63 | Temp 98.1°F | Resp 18

## 2016-05-22 DIAGNOSIS — D839 Common variable immunodeficiency, unspecified: Secondary | ICD-10-CM

## 2016-05-22 LAB — CBC WITH DIFFERENTIAL/PLATELET
BASO%: 0.5 % (ref 0.0–2.0)
BASOS ABS: 0 10*3/uL (ref 0.0–0.1)
EOS ABS: 0.1 10*3/uL (ref 0.0–0.5)
EOS%: 2.7 % (ref 0.0–7.0)
HEMATOCRIT: 41.3 % (ref 38.4–49.9)
HEMOGLOBIN: 14 g/dL (ref 13.0–17.1)
LYMPH#: 1.5 10*3/uL (ref 0.9–3.3)
LYMPH%: 34.5 % (ref 14.0–49.0)
MCH: 30.6 pg (ref 27.2–33.4)
MCHC: 33.9 g/dL (ref 32.0–36.0)
MCV: 90.2 fL (ref 79.3–98.0)
MONO#: 0.3 10*3/uL (ref 0.1–0.9)
MONO%: 5.7 % (ref 0.0–14.0)
NEUT#: 2.5 10*3/uL (ref 1.5–6.5)
NEUT%: 56.6 % (ref 39.0–75.0)
PLATELETS: 254 10*3/uL (ref 140–400)
RBC: 4.58 10*6/uL (ref 4.20–5.82)
RDW: 12.7 % (ref 11.0–14.6)
WBC: 4.4 10*3/uL (ref 4.0–10.3)

## 2016-05-22 MED ORDER — IMMUNE GLOBULIN (HUMAN) 20 GM/200ML IJ SOLN
40.0000 g | Freq: Once | INTRAMUSCULAR | Status: AC
Start: 2016-05-22 — End: 2016-05-22
  Administered 2016-05-22: 40 g via INTRAVENOUS
  Filled 2016-05-22: qty 400

## 2016-05-22 MED ORDER — DIPHENHYDRAMINE HCL 25 MG PO CAPS: 25.0000 mg | ORAL_CAPSULE | Freq: Once | ORAL | Status: DC

## 2016-05-22 MED ORDER — IMMUNE GLOBULIN (HUMAN) 10 GM/100ML IV SOLN: 0.5000 g/kg | Freq: Once | INTRAVENOUS | Status: DC

## 2016-05-22 MED ORDER — ACETAMINOPHEN 325 MG PO TABS: 650.0000 mg | ORAL_TABLET | Freq: Once | ORAL | Status: DC

## 2016-05-22 NOTE — Progress Notes (Signed)
Pt refused to take tylenol and benadryl pre-medication. Pharmacy notified. Pt has never had reaction from IVIG in the past. Medication released and charted as not given - patient refused. Pt VSS and asymptomatic. AVS and labs printed.

## 2016-05-22 NOTE — Patient Instructions (Signed)

## 2016-06-12 ENCOUNTER — Ambulatory Visit (INDEPENDENT_AMBULATORY_CARE_PROVIDER_SITE_OTHER): Payer: Medicare Other | Admitting: Psychiatry

## 2016-06-12 ENCOUNTER — Other Ambulatory Visit (HOSPITAL_BASED_OUTPATIENT_CLINIC_OR_DEPARTMENT_OTHER): Payer: Medicare Other

## 2016-06-12 ENCOUNTER — Encounter (HOSPITAL_COMMUNITY): Payer: Self-pay | Admitting: Psychiatry

## 2016-06-12 ENCOUNTER — Ambulatory Visit (HOSPITAL_BASED_OUTPATIENT_CLINIC_OR_DEPARTMENT_OTHER): Payer: Medicare Other

## 2016-06-12 VITALS — BP 125/59 | HR 64 | Temp 98.2°F | Resp 18

## 2016-06-12 VITALS — BP 122/78 | HR 75 | Ht 66.0 in | Wt 172.4 lb

## 2016-06-12 DIAGNOSIS — Z801 Family history of malignant neoplasm of trachea, bronchus and lung: Secondary | ICD-10-CM

## 2016-06-12 DIAGNOSIS — F339 Major depressive disorder, recurrent, unspecified: Secondary | ICD-10-CM

## 2016-06-12 DIAGNOSIS — D839 Common variable immunodeficiency, unspecified: Secondary | ICD-10-CM

## 2016-06-12 LAB — CBC WITH DIFFERENTIAL/PLATELET
BASO%: 1.4 % (ref 0.0–2.0)
Basophils Absolute: 0.1 10*3/uL (ref 0.0–0.1)
EOS ABS: 0.1 10*3/uL (ref 0.0–0.5)
EOS%: 3.3 % (ref 0.0–7.0)
HEMATOCRIT: 41.2 % (ref 38.4–49.9)
HGB: 14.2 g/dL (ref 13.0–17.1)
LYMPH#: 1.4 10*3/uL (ref 0.9–3.3)
LYMPH%: 37.7 % (ref 14.0–49.0)
MCH: 30.7 pg (ref 27.2–33.4)
MCHC: 34.5 g/dL (ref 32.0–36.0)
MCV: 89.2 fL (ref 79.3–98.0)
MONO#: 0.3 10*3/uL (ref 0.1–0.9)
MONO%: 7.4 % (ref 0.0–14.0)
NEUT%: 50.2 % (ref 39.0–75.0)
NEUTROS ABS: 1.8 10*3/uL (ref 1.5–6.5)
NRBC: 0 % (ref 0–0)
PLATELETS: 247 10*3/uL (ref 140–400)
RBC: 4.62 10*6/uL (ref 4.20–5.82)
RDW: 12.6 % (ref 11.0–14.6)
WBC: 3.6 10*3/uL — AB (ref 4.0–10.3)

## 2016-06-12 MED ORDER — DEXTROSE 5 % IV SOLN
Freq: Once | INTRAVENOUS | Status: AC
  Administered 2016-06-12: 09:00:00 via INTRAVENOUS

## 2016-06-12 MED ORDER — ACETAMINOPHEN 325 MG PO TABS
650.0000 mg | ORAL_TABLET | Freq: Once | ORAL | Status: DC
Start: 2016-06-12 — End: 2016-06-12

## 2016-06-12 MED ORDER — DIPHENHYDRAMINE HCL 25 MG PO CAPS
25.0000 mg | ORAL_CAPSULE | Freq: Once | ORAL | Status: DC
Start: 2016-06-12 — End: 2016-06-12

## 2016-06-12 MED ORDER — IMMUNE GLOBULIN (HUMAN) 20 GM/200ML IJ SOLN
40.0000 g | Freq: Once | INTRAMUSCULAR | Status: AC
  Administered 2016-06-12: 40 g via INTRAVENOUS
  Filled 2016-06-12: qty 400

## 2016-06-12 MED ORDER — IMMUNE GLOBULIN (HUMAN) 10 GM/100ML IV SOLN: 0.5000 g/kg | Freq: Once | INTRAVENOUS | Status: DC

## 2016-06-12 MED ORDER — LEVOMILNACIPRAN HCL ER 20 MG PO CP24: ORAL_CAPSULE | ORAL | 3 refills | Status: DC

## 2016-06-12 NOTE — Patient Instructions (Signed)

## 2016-06-12 NOTE — Progress Notes (Signed)
Psychiatric Initial Adult Assessment   Patient Identification: Leonard Reid MRN:  [id number] Date of Evaluation:  06/12/2016 Referral Source: Dr. Butler DenmarkJerry Taylor Chief Complaint:   Visit Diagnosis: Major depression, chronic  History of Present Illness: This patient is a 70 year old white married male has been dealing with chronic major depression for decades. He says he is feel persistently depressed since 1982. He is on disability for major depression. He's been married 10 years and he says his biggest issue is probably related to his marriage. In today's evaluation the patient was seen for short time. This is the only opportunity we could see him before his next scheduled appointment is in 2 weeks. This therefore was an abbreviated visit. And that we learned a lot. He had a course of TMS at this setting. Fortunately he got no benefit. He's been on multiple different antidepressants and he brought a large list. He describes persistent daily depression. It is not clear how well he is sleeping. He says his sleep pattern is been like this for years. His appetite is okay. He says his energy level is low. He still seems likely get thinking concentrate and he denies worthlessness. He's been in very tense psychotherapy for years he still sees his therapist or has contact with this therapist in [place]. The patient lives in [place] for 7 months and then is in [place] respite time. The patient denies suicidal ideation. He's never made a suicide attempt. In the last week he felt so desperate for care and he was given to go to the hospital seeking to get admitted. Patient is never been in a psychiatric hospital before. The patient is still able to enjoy writing and reading. He does watch TV. In review of his sedative medications and the gene study that he he has never been on Fetzima. He very comment that in the past he's been on Wellbutrin and another medicine and it seemed to benefit him for about a year. At  this time the patient is on Vyvanse to improve his attention and his alertness. For now I would continue him on the Vyvanse. This patient is not suicidal. Associated Signs/Symptoms: Depression Symptoms:  depressed mood, (Hypo) Manic Symptoms:   Anxiety Symptoms:   Psychotic Symptoms:   PTSD Symptoms:   Past Psychiatric History: Multiple antidepressants and psychotherapy  Previous Psychotropic Medications: Yes   Substance Abuse History in the last 12 months:  No.  Consequences of Substance Abuse: NA  Past Medical History:  Past Medical History:  Diagnosis Date  . 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 Hypertension   . Hypogonadism in male   . S/P bronchoscopy   . Thyroid disease     Past Surgical History:  Procedure Laterality Date  . APPENDECTOMY    . HERNIA REPAIR    . laminectomy    . MENISCUS REPAIR      Family Psychiatric History:   Family History:  Family History  Problem Relation Age of Onset  . Cancer Father     esophageal ca    Social History:   Social History   Social History  . Marital status: Married    Spouse name: N/A  . Number of children: N/A  . Years of education: N/A   Social History Main Topics  . Smoking status: Never Smoker  . Smokeless tobacco: None  . Alcohol use 0.6 oz/week    1 Glasses of wine per week  . Drug use: No  . Sexual activity: Not Asked  Other Topics Concern  . None   Social History Narrative  . None    Additional Social History:   Allergies:  No Known Allergies  Metabolic Disorder Labs: No results found for: HGBA1C, MPG No results found for: PROLACTIN No results found for: CHOL, TRIG, HDL, CHOLHDL, VLDL, LDLCALC   Current Medications: Current Outpatient Prescriptions  Medication Sig Dispense Refill  . Ergocalciferol (VITAMIN D2 PO) Take 5,000 Units by mouth daily.     Marland Kitchen lisdexamfetamine (VYVANSE) 70 MG capsule Take 1 capsule (70 mg total) by mouth daily. 30 capsule 0  . losartan  (COZAAR) 50 MG tablet Take 50 mg by mouth daily.    . Nutritional Supplements (DHEA PO) Take 25 mg by mouth 2 (two) times daily.    . pantoprazole (PROTONIX) 40 MG tablet Take 40 mg by mouth daily.    Marland Kitchen thyroid (ARMOUR) 60 MG tablet Take 60 mg by mouth daily before breakfast.    . UNABLE TO FIND Testosterone 200mg /g in DMSO, two pumps per site in am and pm    . vitamin B-12 (CYANOCOBALAMIN) 100 MCG tablet Take 100 mcg by mouth daily.    Marland Kitchen amphetamine-dextroamphetamine (ADDERALL) 30 MG tablet Take 1 tablet by mouth 2 (two) times daily. (Patient not taking: Reported on 06/12/2016) 60 tablet 0  . ipratropium (ATROVENT HFA) 17 MCG/ACT inhaler Inhale 2 puffs into the lungs as needed for wheezing.    . levalbuterol (XOPENEX) 0.31 MG/3ML nebulizer solution Take 1 ampule by nebulization as needed for wheezing.    . Levomilnacipran HCl ER (FETZIMA) 20 MG CP24 1  qam 20 capsule 3  . MELATONIN PO Take by mouth at bedtime. 5 to 10 mg     No current facility-administered medications for this visit.     Neurologic: Headache: No Seizure: No Paresthesias:No  Musculoskeletal: Strength & Muscle Tone: within normal limits Gait & Station: normal Patient leans: N/A  Psychiatric Specialty Exam: ROS  Blood pressure 122/78, pulse 75, height 5\' 6"  (1.676 m), weight 172 lb 6.4 oz (78.2 kg).Body mass index is 27.83 kg/m.  General Appearance: Casual  Eye Contact:  Good  Speech:  Clear and Coherent  Volume:  Normal  Mood:  Depressed  Affect:  Appropriate  Thought Process:  Goal Directed  Orientation:  Full (Time, Place, and Person)  Thought Content:  WDL  Suicidal Thoughts:  No  Homicidal Thoughts:  No  Memory:  Negative  Judgement:  Good  Insight:  Good  Psychomotor Activity:  Normal  Concentration:    Recall:  Good  Fund of Knowledge:Good  Language: Fair  Akathisia:  No  Handed:  Right  AIMS (if indicated):    Assets:  Desire for Improvement  ADL's:  Intact  Cognition: WNL  Sleep:       Treatment Plan Summary: At this time this patient will return to see me in the second part of this evaluation. This certainly is a lot of missing information at this time. We will review the presence or absence of psychotic symptoms, manic symptoms and specific symptoms of an anxiety disorder. The patient states he's been followed by 3 different psychiatrists and has been on over a dozen different psychotropic agents. He brought a list with him that we reviewed. At this time makes the most sense to begin a low-dose of  Fetzima 20 mg. For now will continue taking his Vyvanse. Patient is not suicidal. He she'll return to see me in approximately 2 weeks we will complete this assessment.  Lucas Mallow, MD 10/11/20175:09 PM

## 2016-06-12 NOTE — Progress Notes (Signed)
Pt refused pre medications, stating he has never taken them and never has had a reaction.

## 2016-06-19 ENCOUNTER — Encounter: Payer: Self-pay | Admitting: Family Medicine

## 2016-06-19 ENCOUNTER — Ambulatory Visit (INDEPENDENT_AMBULATORY_CARE_PROVIDER_SITE_OTHER): Payer: Medicare Other | Admitting: Family Medicine

## 2016-06-19 VITALS — BP 122/72 | HR 94 | Temp 98.8°F | Resp 17 | Ht 67.0 in | Wt 171.0 lb

## 2016-06-19 DIAGNOSIS — J069 Acute upper respiratory infection, unspecified: Secondary | ICD-10-CM | POA: Diagnosis not present

## 2016-06-19 MED ORDER — PREDNISONE 20 MG PO TABS: 40.0000 mg | ORAL_TABLET | Freq: Every day | ORAL | 0 refills | Status: DC

## 2016-06-19 MED ORDER — AZITHROMYCIN 250 MG PO TABS: ORAL_TABLET | ORAL | 0 refills | Status: DC

## 2016-06-19 NOTE — Patient Instructions (Addendum)
Please take the Prednisone two tablets once a day.  Prednisone alone may be all you need to improve your symptoms. If you still feel the chest congestion, and notice that you are still have cough without improvement then feel free to fill your prescription of azithromycin.    Do not hesitate to contact us if you have any further questions.   Dr. Creta LevinStallings    IF you received an x-ray today, you will receive an invoice from [facility]. Please contact [facility] at [phone] with questions or concerns regarding your invoice.   IF you received labwork today, you will receive an invoice from [facility]. Please contact [facility] at [phone] with questions or concerns regarding your invoice.   Our billing staff will not be able to assist you with questions regarding bills from these companies.  You will be contacted with the lab results as soon as they are available. The fastest way to get your results is to activate your My Chart account. Instructions are located on the last page of this paperwork. If you have not heard from us regarding the results in 2 weeks, please contact this office.      Upper Respiratory Infection, Adult Most upper respiratory infections (URIs) are a viral infection of the air passages leading to the lungs. A URI affects the nose, throat, and upper air passages. The most common type of URI is nasopharyngitis and is typically referred to as "the common cold." URIs run their course and usually go away on their own. Most of the time, a URI does not require medical attention, but sometimes a bacterial infection in the upper airways can follow a viral infection. This is called a secondary infection. Sinus and middle ear infections are common types of secondary upper respiratory infections. Bacterial pneumonia can also complicate a URI. A URI can worsen asthma and chronic obstructive pulmonary disease (COPD). Sometimes, these  complications can require emergency medical care and may be life threatening.  CAUSES Almost all URIs are caused by viruses. A virus is a type of germ and can spread from one person to another.  RISKS FACTORS You may be at risk for a URI if:   You smoke.   You have chronic heart or lung disease.  You have a weakened defense (immune) system.   You are very young or very old.   You have nasal allergies or asthma.  You work in crowded or poorly ventilated areas.  You work in health care facilities or schools. SIGNS AND SYMPTOMS  Symptoms typically develop 2-3 days after you come in contact with a cold virus. Most viral URIs last 7-10 days. However, viral URIs from the influenza virus (flu virus) can last 14-18 days and are typically more severe. Symptoms may include:   Runny or stuffy (congested) nose.   Sneezing.   Cough.   Sore throat.   Headache.   Fatigue.   Fever.   Loss of appetite.   Pain in your forehead, behind your eyes, and over your cheekbones (sinus pain).  Muscle aches.  DIAGNOSIS  Your health care provider may diagnose a URI by:  Physical exam.  Tests to check that your symptoms are not due to another condition such as:  Strep throat.  Sinusitis.  Pneumonia.  Asthma. TREATMENT  A URI goes away on its own with time. It cannot be cured with medicines, but medicines may be prescribed or recommended to relieve symptoms. Medicines may help:  Reduce your fever.  Reduce your cough.  Relieve nasal congestion. HOME CARE INSTRUCTIONS   Take medicines only as directed by your health care provider.   Gargle warm saltwater or take cough drops to comfort your throat as directed by your health care provider.  Use a warm mist humidifier or inhale steam from a shower to increase air moisture. This may make it easier to breathe.  Drink enough fluid to keep your urine clear or pale yellow.   Eat soups and other clear broths and maintain  good nutrition.   Rest as needed.   Return to work when your temperature has returned to normal or as your health care provider advises. You may need to stay home longer to avoid infecting others. You can also use a face mask and careful hand washing to prevent spread of the virus.  Increase the usage of your inhaler if you have asthma.   Do not use any tobacco products, including cigarettes, chewing tobacco, or electronic cigarettes. If you need help quitting, ask your health care provider. PREVENTION  The best way to protect yourself from getting a cold is to practice good hygiene.   Avoid oral or hand contact with people with cold symptoms.   Wash your hands often if contact occurs.  There is no clear evidence that vitamin C, vitamin E, echinacea, or exercise reduces the chance of developing a cold. However, it is always recommended to get plenty of rest, exercise, and practice good nutrition.  SEEK MEDICAL CARE IF:   You are getting worse rather than better.   Your symptoms are not controlled by medicine.   You have chills.  You have worsening shortness of breath.  You have brown or red mucus.  You have yellow or brown nasal discharge.  You have pain in your face, especially when you bend forward.  You have a fever.  You have swollen neck glands.  You have pain while swallowing.  You have white areas in the back of your throat. SEEK IMMEDIATE MEDICAL CARE IF:   You have severe or persistent:  Headache.  Ear pain.  Sinus pain.  Chest pain.  You have chronic lung disease and any of the following:  Wheezing.  Prolonged cough.  Coughing up blood.  A change in your usual mucus.  You have a stiff neck.  You have changes in your:  Vision.  Hearing.  Thinking.  Mood. MAKE SURE YOU:   Understand these instructions.  Will watch your condition.  Will get help right away if you are not doing well or get worse.   This information is not  intended to replace advice given to you by your health care provider. Make sure you discuss any questions you have with your health care provider.   Document Released: 02/12/2001 Document Revised: 01/03/2015 Document Reviewed: 11/24/2013 Elsevier Interactive Patient Education [employer]

## 2016-06-19 NOTE — Progress Notes (Signed)
Chief Complaint  Patient presents with  . Cough  . 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 He is experiencing chest tightness, coughing, sore throat, with no fevers or chills, no nausea or vomiting, no wheezing   He takes Protonix for her Hiatal hernia and GERD.  The Protonix helped to improve his cough dramatically.  He reports that he has not had any recent travel He reports that his last trip from [place] was 05/08/16.   Past Medical History:  Diagnosis Date  . 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 Hypertension   . Hypogonadism in male   . S/P bronchoscopy   . Thyroid disease     Current Outpatient Prescriptions  Medication Sig Dispense Refill  . Ergocalciferol (VITAMIN D2 PO) Take 5,000 Units by mouth daily.     Marland Kitchen. ipratropium (ATROVENT HFA) 17 MCG/ACT inhaler Inhale 2 puffs into the lungs as needed for wheezing.    . levalbuterol (XOPENEX) 0.31 MG/3ML nebulizer solution Take 1 ampule by nebulization as needed for wheezing.    . Levomilnacipran HCl ER (FETZIMA) 20 MG CP24 1  qam 20 capsule 3  . lisdexamfetamine (VYVANSE) 70 MG capsule Take 1 capsule (70 mg total) by mouth daily. 30 capsule 0  . losartan (COZAAR) 50 MG tablet Take 50 mg by mouth daily.    Marland Kitchen. MELATONIN PO Take by mouth at bedtime. 5 to 10 mg    . Nutritional Supplements (DHEA PO) Take 25 mg by mouth 2 (two) times daily.    . pantoprazole (PROTONIX) 40 MG tablet Take 40 mg by mouth daily.    Marland Kitchen. thyroid (ARMOUR) 60 MG tablet Take 60 mg by mouth daily before breakfast.    . UNABLE TO FIND Testosterone 200mg /g in DMSO, two pumps per site in am and pm    . vitamin B-12 (CYANOCOBALAMIN) 100 MCG tablet Take 100 mcg by mouth daily.    Marland Kitchen. azithromycin (ZITHROMAX) 250 MG tablet Take 2 tablets on the first day, then  one tablet each day after. 6 tablet 0  . predniSONE (DELTASONE) 20 MG tablet Take 2 tablets (40 mg total) by mouth daily with breakfast. 10 tablet 0   No current facility-administered medications for this visit.     Allergies: No Known Allergies  Past Surgical History:  Procedure Laterality Date  . APPENDECTOMY    . HERNIA REPAIR    . laminectomy    . MENISCUS REPAIR      Social History   Social History  . Marital status: Married    Spouse name: N/A  . Number of children: N/A  . Years of education: N/A   Social History Main Topics  . Smoking status: Never Smoker  . Smokeless tobacco: None  . Alcohol use 0.6 oz/week    1 Glasses of wine per week  . Drug use: No  . Sexual activity: Not Asked   Other Topics Concern  . None   Social History Narrative  . None    ROS SEE HPI  Objective: Vitals:   06/19/16 1458  BP: 122/72  Pulse: 94  Resp: 17  Temp: 98.8 F (37.1 C)  TempSrc: Oral  SpO2: 98%  Weight: 171 lb (77.6 kg)  Height: 5\' 7"  (1.702 m)    Physical Exam  General: alert, oriented, in NAD Head: normocephalic, atraumatic, no sinus tenderness Eyes: EOM intact, no scleral icterus or conjunctival injection Ears: TM clear bilaterally Throat: no pharyngeal exudate or erythema Lymph: no posterior auricular, submental or cervical lymph adenopathy Heart: normal rate, normal sinus rhythm, no murmurs Lungs: clear to auscultation bilaterally, no wheezing    Assessment and Plan Gerrald was seen today for cough and uri.  Diagnoses and all orders for this visit:  Acute URI  Given chest congestion advised pt to take steroid burst Gave instructions on when to take zpak Gave printed prescription to use for bronchitis or sinusitis symptoms Pt verbalized understand Follow up prn -     predniSONE (DELTASONE) 20 MG tablet; Take 2 tablets (40 mg total) by mouth daily with breakfast. -     azithromycin (ZITHROMAX) 250 MG tablet; Take 2 tablets on the first day,  then one tablet each day after.     Laylynn Campanella A [place]

## 2016-06-24 ENCOUNTER — Telehealth (HOSPITAL_COMMUNITY): Payer: Self-pay

## 2016-06-25 ENCOUNTER — Ambulatory Visit (HOSPITAL_COMMUNITY): Payer: Medicare Other | Admitting: Psychiatry

## 2016-07-02 ENCOUNTER — Ambulatory Visit (INDEPENDENT_AMBULATORY_CARE_PROVIDER_SITE_OTHER): Payer: Medicare Other

## 2016-07-02 ENCOUNTER — Ambulatory Visit (INDEPENDENT_AMBULATORY_CARE_PROVIDER_SITE_OTHER): Payer: Medicare Other | Admitting: Physician Assistant

## 2016-07-02 VITALS — BP 124/80 | HR 68 | Temp 97.6°F | Resp 18 | Ht 67.0 in | Wt 171.0 lb

## 2016-07-02 DIAGNOSIS — R05 Cough: Secondary | ICD-10-CM | POA: Diagnosis not present

## 2016-07-02 DIAGNOSIS — R059 Cough, unspecified: Secondary | ICD-10-CM

## 2016-07-02 DIAGNOSIS — J209 Acute bronchitis, unspecified: Secondary | ICD-10-CM | POA: Diagnosis not present

## 2016-07-02 MED ORDER — PREDNISONE 20 MG PO TABS: 40.0000 mg | ORAL_TABLET | Freq: Every day | ORAL | 0 refills | Status: DC

## 2016-07-02 MED ORDER — DOXYCYCLINE HYCLATE 100 MG PO CAPS: 100.0000 mg | ORAL_CAPSULE | Freq: Two times a day (BID) | ORAL | 0 refills | Status: DC

## 2016-07-02 NOTE — Progress Notes (Signed)
    MRN: [id number] DOB: 1945/09/18  Subjective:   Leonard Reid is a 70 y.o. male presenting for follow up on URI. Was seen here 10/18 by Dr. Creta LevinStallings. Advised to take steroid burst with z-pack to follow. Since that visit, he reports he felt bad the first few days, then started to feet better, however never returned to 100%. Yesterday reports increasing chest tightness, with non-productive cough.  Denies chills, fevers, SOB, nausea, vomiting, diarrhea.   History of CVID x five years - IVIG infusion every three weeks, next infusion is tomorrow. Reports history of bronchial issues, which have since cleared since initiation of IVIG therapy.    Leonard Reid has a current medication list which includes the following prescription(s): ergocalciferol, ipratropium, levalbuterol, levomefolate glucosamine, levomilnacipran hcl er, lisdexamfetamine, losartan, melatonin, nutritional supplements, pantoprazole, thyroid, UNABLE TO FIND, and vitamin b-12. Also has No Known Allergies.  Leonard Reid  has a past medical history of CVID (common variable immunodeficiency) (HCC) (10/26/2015); Hypertension; Hypogonadism in male; S/P bronchoscopy; and Thyroid disease. Also  has a past surgical history that includes Hernia repair; Meniscus repair; laminectomy; and Appendectomy.  Objective:   Vitals: BP 124/80 (BP Location: Right Arm, Patient Position: Sitting, Cuff Size: Small)   Pulse 68   Temp 97.6 F (36.4 C) (Oral)   Resp 18   Ht 5\' 7"  (1.702 m)   Wt 171 lb (77.6 kg)   SpO2 99%   BMI 26.78 kg/m   Physical Exam  Constitutional: He is oriented to person, place, and time. He appears well-developed and well-nourished. No distress.  Cardiovascular: Normal rate, regular rhythm and normal heart sounds.   Pulmonary/Chest: Effort normal and breath sounds normal. No respiratory distress. He has no wheezes. He has no rales.  Abdominal: Soft. There is no tenderness.  Neurological: He is alert and oriented to person, place, and  time.  Skin: Skin is warm and dry. Capillary refill takes 2 to 3 seconds.  Psychiatric: He has a normal mood and affect. His behavior is normal. Judgment and thought content normal.  Vitals reviewed.   No results found for this or any previous visit (from the past 24 hour(s)).  Dg Chest 2 View  Result Date: 07/02/2016 CLINICAL DATA:  Chest tightness and nonproductive cough. EXAM: CHEST  2 VIEW COMPARISON:  None. FINDINGS: The heart size and mediastinal contours are within normal limits. Both lungs are clear. The visualized skeletal structures are unremarkable. IMPRESSION: Normal chest x-ray. Electronically Signed   By: Rudie MeyerP.  Gallerani M.D.   On: 07/02/2016 09:35    Assessment and Plan :  1. Acute bronchitis, unspecified organism 2. Cough - predniSONE (DELTASONE) 20 MG tablet; Take 2 tablets (40 mg total) by mouth daily with breakfast.  Dispense: 10 tablet; Refill: 0 - doxycycline (VIBRAMYCIN) 100 MG capsule; Take 1 capsule (100 mg total) by mouth 2 (two) times daily.  Dispense: 20 capsule; Refill: 0 - DG Chest 2 View; Future - Patient instructed to start course of steroids, then initiate antibiotics if needed. RTC if symptoms do not improve/worsen. Patient understands and agrees with plan.   Marco CollieWhitney Caressa Scearce, PA-C  [facility] 07/02/2016 9:07 AM

## 2016-07-02 NOTE — Patient Instructions (Addendum)
     IF you received an x-ray today, you will receive an invoice from [facility]. Please contact [facility] at [phone] with questions or concerns regarding your invoice.   IF you received labwork today, you will receive an invoice from [facility]. Please contact [facility] at [phone] with questions or concerns regarding your invoice.   Our billing staff will not be able to assist you with questions regarding bills from these companies.  You will be contacted with the lab results as soon as they are available. The fastest way to get your results is to activate your My Chart account. Instructions are located on the last page of this paperwork. If you have not heard from us regarding the results in 2 weeks, please contact this office.      

## 2016-07-03 ENCOUNTER — Ambulatory Visit (HOSPITAL_BASED_OUTPATIENT_CLINIC_OR_DEPARTMENT_OTHER): Payer: Medicare Other

## 2016-07-03 ENCOUNTER — Other Ambulatory Visit (HOSPITAL_BASED_OUTPATIENT_CLINIC_OR_DEPARTMENT_OTHER): Payer: Medicare Other

## 2016-07-03 VITALS — BP 135/65 | HR 80 | Temp 98.3°F | Resp 18

## 2016-07-03 DIAGNOSIS — D839 Common variable immunodeficiency, unspecified: Secondary | ICD-10-CM | POA: Diagnosis present

## 2016-07-03 LAB — CBC WITH DIFFERENTIAL/PLATELET
BASO%: 0.2 % (ref 0.0–2.0)
BASOS ABS: 0 10*3/uL (ref 0.0–0.1)
EOS ABS: 0 10*3/uL (ref 0.0–0.5)
EOS%: 0.3 % (ref 0.0–7.0)
HEMATOCRIT: 40.4 % (ref 38.4–49.9)
HEMOGLOBIN: 13.7 g/dL (ref 13.0–17.1)
LYMPH#: 1.5 10*3/uL (ref 0.9–3.3)
LYMPH%: 25.5 % (ref 14.0–49.0)
MCH: 30.7 pg (ref 27.2–33.4)
MCHC: 33.9 g/dL (ref 32.0–36.0)
MCV: 90.6 fL (ref 79.3–98.0)
MONO#: 0.5 10*3/uL (ref 0.1–0.9)
MONO%: 8.4 % (ref 0.0–14.0)
NEUT%: 65.6 % (ref 39.0–75.0)
NEUTROS ABS: 3.9 10*3/uL (ref 1.5–6.5)
NRBC: 0 % (ref 0–0)
PLATELETS: 266 10*3/uL (ref 140–400)
RBC: 4.46 10*6/uL (ref 4.20–5.82)
RDW: 12.8 % (ref 11.0–14.6)
WBC: 5.9 10*3/uL (ref 4.0–10.3)

## 2016-07-03 MED ORDER — IMMUNE GLOBULIN (HUMAN) 20 GM/200ML IV SOLN
0.5000 g/kg | Freq: Once | INTRAVENOUS | Status: AC
  Administered 2016-07-03: 40 g via INTRAVENOUS
  Filled 2016-07-03: qty 400

## 2016-07-03 MED ORDER — IMMUNE GLOBULIN (HUMAN) 10 GM/100ML IV SOLN
0.5000 g/kg | Freq: Once | INTRAVENOUS | Status: DC
  Filled 2016-07-03: qty 400

## 2016-07-03 MED ORDER — DEXTROSE 5 % IV SOLN
Freq: Once | INTRAVENOUS | Status: AC
  Administered 2016-07-03: 10:00:00 via INTRAVENOUS

## 2016-07-03 MED ORDER — ACETAMINOPHEN 325 MG PO TABS: 650.0000 mg | ORAL_TABLET | Freq: Once | ORAL | Status: DC

## 2016-07-03 MED ORDER — DIPHENHYDRAMINE HCL 25 MG PO CAPS: 25.0000 mg | ORAL_CAPSULE | Freq: Once | ORAL | Status: DC

## 2016-07-03 NOTE — Progress Notes (Signed)
Patient tolerated treatment well. Patient observed for 30 minutes post transfusion. Patient and vital signs stable upon discharge.

## 2016-07-03 NOTE — Patient Instructions (Signed)

## 2016-10-04 ENCOUNTER — Telehealth: Payer: Self-pay | Admitting: Hematology and Oncology

## 2016-10-04 NOTE — Telephone Encounter (Signed)
Lt vm to inquire of last treatment

## 2016-10-07 ENCOUNTER — Telehealth: Payer: Self-pay | Admitting: Hematology and Oncology

## 2016-10-07 NOTE — Telephone Encounter (Signed)
Lt vm regarding last tx date.

## 2016-10-09 ENCOUNTER — Encounter: Payer: Self-pay | Admitting: *Deleted

## 2016-10-10 ENCOUNTER — Telehealth: Payer: Self-pay | Admitting: *Deleted

## 2016-10-10 NOTE — Telephone Encounter (Signed)
Spoke with patient regarding IVIG treatments. He is wanting to be treated Friday or Monday as he is leaving  Tuesday for [facility] to visit family for 10 days.. States he is "bi-coastal" and needs to get his IVIG ASAP. Last treatment was 09/08/16 at home in [place]. Dr Bertis RuddyGorsuch' schedule is full for the next few days. He was insistent that we fit him in, but with his travel plans we are not able to accommodate. She will be able to see him when he returns from [place]. Dr Bertis RuddyGorsuch offered that he could be admitted this week to get IVIG now. He did not want to be admitted.  Reminded him that he needs to arrange his future appts with Dr Bertis RuddyGorsuch when he sees her on 10/28/16. States he will be going back to [place] in March. Needs to go ahead and let us know when he will be back in [place] so we can get him scheduled now due her high volume of patients.   Pt verbalized understanding of conversation.  LM with appts 2/26: 0830 lab 0915 Dr Bertis RuddyGorsuch with IVIG afterwards.  To call if has questions

## 2016-10-15 ENCOUNTER — Other Ambulatory Visit: Payer: Self-pay | Admitting: Hematology and Oncology

## 2016-10-22 ENCOUNTER — Other Ambulatory Visit: Payer: Self-pay | Admitting: Hematology and Oncology

## 2016-10-22 DIAGNOSIS — D839 Common variable immunodeficiency, unspecified: Secondary | ICD-10-CM

## 2016-10-23 ENCOUNTER — Telehealth: Payer: Self-pay | Admitting: *Deleted

## 2016-10-23 NOTE — Telephone Encounter (Signed)
No note

## 2016-10-28 ENCOUNTER — Ambulatory Visit (HOSPITAL_BASED_OUTPATIENT_CLINIC_OR_DEPARTMENT_OTHER): Payer: Medicare Other | Admitting: Hematology and Oncology

## 2016-10-28 ENCOUNTER — Encounter: Payer: Self-pay | Admitting: Hematology and Oncology

## 2016-10-28 ENCOUNTER — Ambulatory Visit (HOSPITAL_BASED_OUTPATIENT_CLINIC_OR_DEPARTMENT_OTHER): Payer: Medicare Other

## 2016-10-28 ENCOUNTER — Other Ambulatory Visit (HOSPITAL_BASED_OUTPATIENT_CLINIC_OR_DEPARTMENT_OTHER): Payer: Medicare Other

## 2016-10-28 VITALS — BP 140/75 | HR 87 | Temp 98.4°F | Resp 18

## 2016-10-28 DIAGNOSIS — D839 Common variable immunodeficiency, unspecified: Secondary | ICD-10-CM

## 2016-10-28 DIAGNOSIS — N183 Chronic kidney disease, stage 3 unspecified: Secondary | ICD-10-CM

## 2016-10-28 LAB — CBC WITH DIFFERENTIAL/PLATELET
BASO%: 0.6 % (ref 0.0–2.0)
Basophils Absolute: 0 10*3/uL (ref 0.0–0.1)
EOS ABS: 0.1 10*3/uL (ref 0.0–0.5)
EOS%: 1.7 % (ref 0.0–7.0)
HEMATOCRIT: 46.2 % (ref 38.4–49.9)
HEMOGLOBIN: 15.7 g/dL (ref 13.0–17.1)
LYMPH#: 1.5 10*3/uL (ref 0.9–3.3)
LYMPH%: 32.7 % (ref 14.0–49.0)
MCH: 31.2 pg (ref 27.2–33.4)
MCHC: 34 g/dL (ref 32.0–36.0)
MCV: 91.8 fL (ref 79.3–98.0)
MONO#: 0.4 10*3/uL (ref 0.1–0.9)
MONO%: 8.1 % (ref 0.0–14.0)
NEUT%: 56.9 % (ref 39.0–75.0)
NEUTROS ABS: 2.7 10*3/uL (ref 1.5–6.5)
Platelets: 239 10*3/uL (ref 140–400)
RBC: 5.03 10*6/uL (ref 4.20–5.82)
RDW: 12.5 % (ref 11.0–14.6)
WBC: 4.7 10*3/uL (ref 4.0–10.3)

## 2016-10-28 LAB — COMPREHENSIVE METABOLIC PANEL
ALBUMIN: 4.1 g/dL (ref 3.5–5.0)
ALK PHOS: 91 U/L (ref 40–150)
ALT: 28 U/L (ref 0–55)
AST: 27 U/L (ref 5–34)
Anion Gap: 8 mEq/L (ref 3–11)
BUN: 20.1 mg/dL (ref 7.0–26.0)
CALCIUM: 9.7 mg/dL (ref 8.4–10.4)
CO2: 25 mEq/L (ref 22–29)
Chloride: 103 mEq/L (ref 98–109)
Creatinine: 1.5 mg/dL — ABNORMAL HIGH (ref 0.7–1.3)
EGFR: 49 mL/min/{1.73_m2} — ABNORMAL LOW (ref >90)
GLUCOSE: 87 mg/dL (ref 70–140)
Potassium: 4.1 mEq/L (ref 3.5–5.1)
SODIUM: 137 meq/L (ref 136–145)
TOTAL PROTEIN: 7.4 g/dL (ref 6.4–8.3)
Total Bilirubin: 0.83 mg/dL (ref 0.20–1.20)

## 2016-10-28 MED ORDER — IMMUNE GLOBULIN (HUMAN) 20 GM/200ML IV SOLN: 0.5000 g/kg | INTRAVENOUS | Status: DC

## 2016-10-28 MED ORDER — ACETAMINOPHEN 325 MG PO TABS
650.0000 mg | ORAL_TABLET | Freq: Once | ORAL | Status: AC
  Administered 2016-10-28: 650 mg via ORAL

## 2016-10-28 MED ORDER — ACETAMINOPHEN 325 MG PO TABS
ORAL_TABLET | ORAL | Status: AC
  Filled 2016-10-28: qty 2

## 2016-10-28 MED ORDER — DIPHENHYDRAMINE HCL 25 MG PO TABS
25.0000 mg | ORAL_TABLET | Freq: Once | ORAL | Status: AC
Start: 2016-10-28 — End: 2016-10-28
  Administered 2016-10-28: 25 mg via ORAL
  Filled 2016-10-28: qty 1

## 2016-10-28 MED ORDER — SODIUM CHLORIDE 0.9 % IV SOLN
Freq: Once | INTRAVENOUS | Status: AC
  Administered 2016-10-28: 10:00:00 via INTRAVENOUS

## 2016-10-28 MED ORDER — IMMUNE GLOBULIN (HUMAN) 20 GM/200ML IJ SOLN
0.5000 g/kg | Freq: Once | INTRAMUSCULAR | Status: AC
  Administered 2016-10-28: 40 g via INTRAVENOUS
  Filled 2016-10-28: qty 400

## 2016-10-28 MED ORDER — DIPHENHYDRAMINE HCL 25 MG PO CAPS
ORAL_CAPSULE | ORAL | Status: AC
  Filled 2016-10-28: qty 1

## 2016-10-28 NOTE — Progress Notes (Signed)
[facility] OFFICE PROGRESS NOTE  No PCP Per Patient SUMMARY OF HEMATOLOGIC HISTORY: Leonard Reid is here because of CVID requiring chronic IVIG treatment. He is requesting continuous IVIG prescription here. He was diagnosed with CVID approximately 4 years ago. He has a lot of recurrent bronchial infection and bronchiectasis in the past. Since he was prescribed IVIG, he denies further recurrent infection. His last CT scan and pulmonary function test showed improvement of his lung function. The patient has hiatal hernia and chronic reflux and is currently taking proton pump inhibitor for mild cough. The patient has chronic upper respiratory tract infection since childhood. he denies abnormal skin rashes.  he denies intermittent abdominal pain, bloating or diarrhea. He denies infusion reaction with IVIG. INTERVAL HISTORY: Leonard Reid 71 y.o. male returns for follow-up. He feels well. No recent infection. Denies infusion reaction to IVIG.  I have reviewed the past medical history, past surgical history, social history and family history with the patient and they are unchanged from previous note.  ALLERGIES:  has No Known Allergies.  MEDICATIONS:  Current Outpatient Prescriptions  Medication Sig Dispense Refill  . Ergocalciferol (VITAMIN D2 PO) Take 5,000 Units by mouth daily.     Marland Kitchen ipratropium (ATROVENT HFA) 17 MCG/ACT inhaler Inhale 2 puffs into the lungs as needed for wheezing.    . levalbuterol (XOPENEX) 0.31 MG/3ML nebulizer solution Take 1 ampule by nebulization as needed for wheezing.    . Levomefolate Glucosamine (METHYLFOLATE PO) Take by mouth.    . Levomilnacipran HCl ER (FETZIMA) 20 MG CP24 1  qam 20 capsule 3  . lisdexamfetamine (VYVANSE) 70 MG capsule Take 1 capsule (70 mg total) by mouth daily. 30 capsule 0  . losartan (COZAAR) 50 MG tablet Take 50 mg by mouth daily.    . Nutritional Supplements (DHEA PO) Take 25 mg by mouth 2 (two) times daily.    .  pantoprazole (PROTONIX) 40 MG tablet Take 40 mg by mouth daily.    Marland Kitchen thyroid (ARMOUR) 60 MG tablet Take 60 mg by mouth daily before breakfast.    . UNABLE TO FIND Testosterone 200mg /g in DMSO, two pumps per site in am and pm    . vitamin B-12 (CYANOCOBALAMIN) 100 MCG tablet Take 100 mcg by mouth daily.     No current facility-administered medications for this visit.    Facility-Administered Medications Ordered in Other Visits  Medication Dose Route Frequency Provider Last Rate Last Dose  . 0.9 %  sodium chloride infusion   Intravenous Once Artis Delay, MD         REVIEW OF SYSTEMS:   Constitutional: Denies fevers, chills or night sweats Eyes: Denies blurriness of vision Ears, nose, mouth, throat, and face: Denies mucositis or sore throat Respiratory: Denies cough, dyspnea or wheezes Cardiovascular: Denies palpitation, chest discomfort or lower extremity swelling Gastrointestinal:  Denies nausea, heartburn or change in bowel habits Skin: Denies abnormal skin rashes Lymphatics: Denies new lymphadenopathy or easy bruising Neurological:Denies numbness, tingling or new weaknesses Behavioral/Psych: Mood is stable, no new changes  All other systems were reviewed with the patient and are negative.  PHYSICAL EXAMINATION: ECOG PERFORMANCE STATUS: 0 - Asymptomatic  Vitals:   10/28/16 0907  BP: 137/76  Pulse: 82  Resp: 18  Temp: 97.7 F (36.5 C)   Filed Weights   10/28/16 0907  Weight: 172 lb (78 kg)    GENERAL:alert, no distress and comfortable SKIN: skin color, texture, turgor are normal, no rashes or significant lesions EYES: normal, Conjunctiva  are pink and non-injected, sclera clear OROPHARYNX:no exudate, no erythema and lips, buccal mucosa, and tongue normal  NECK: supple, thyroid normal size, non-tender, without nodularity LYMPH:  no palpable lymphadenopathy in the cervical, axillary or inguinal LUNGS: clear to auscultation and percussion with normal breathing effort HEART:  regular rate & rhythm and no murmurs and no lower extremity edema ABDOMEN:abdomen soft, non-tender and normal bowel sounds Musculoskeletal:no cyanosis of digits and no clubbing  NEURO: alert & oriented x 3 with fluent speech, no focal motor/sensory deficits  LABORATORY DATA:  I have reviewed the data as listed     Component Value Date/Time   NA 137 10/28/2016 0847   K 4.1 10/28/2016 0847   CO2 25 10/28/2016 0847   GLUCOSE 87 10/28/2016 0847   BUN 20.1 10/28/2016 0847   CREATININE 1.5 (H) 10/28/2016 0847   CALCIUM 9.7 10/28/2016 0847   PROT 7.4 10/28/2016 0847   ALBUMIN 4.1 10/28/2016 0847   AST 27 10/28/2016 0847   ALT 28 10/28/2016 0847   ALKPHOS 91 10/28/2016 0847   BILITOT 0.83 10/28/2016 0847    No results found for: SPEP, UPEP  Lab Results  Component Value Date   WBC 4.7 10/28/2016   NEUTROABS 2.7 10/28/2016   HGB 15.7 10/28/2016   HCT 46.2 10/28/2016   MCV 91.8 10/28/2016   PLT 239 10/28/2016      Chemistry      Component Value Date/Time   NA 137 10/28/2016 0847   K 4.1 10/28/2016 0847   CO2 25 10/28/2016 0847   BUN 20.1 10/28/2016 0847   CREATININE 1.5 (H) 10/28/2016 0847      Component Value Date/Time   CALCIUM 9.7 10/28/2016 0847   ALKPHOS 91 10/28/2016 0847   AST 27 10/28/2016 0847   ALT 28 10/28/2016 0847   BILITOT 0.83 10/28/2016 0847      ASSESSMENT & PLAN:  CVID (common variable immunodeficiency) (HCC) He has been receiving IVIG treatment in [place] and since he will be in [place] transient[place] for the next 3 months, I will prescribe IVIG for him. He has been receiving Gammagard 40 g IV every 3 weeks without any side effects. I will prescribe the same. He will get premedication with Tylenol and Benadryl before treatment. I have recently obtained insurance prior authorization up until September 2018. We discussed future treatment. I have repeated IgG level today. If his IgG level is adequate, we might have a problem getting insurance  authorization for future treatment.  The patient is aware.  I will call him in a few days once I have results available. So far, since he has been receiving chronic IVIG treatment, he denies recurrent infection  Chronic kidney disease, stage III (moderate) Likely related to aging and chronic hypertension.  Continue medical management   No orders of the defined types were placed in this encounter.   All questions were answered. The patient knows to call the clinic with any problems, questions or concerns. No barriers to learning was detected.  I spent 15 minutes counseling the patient face to face. The total time spent in the appointment was 20 minutes and more than 50% was on counseling.     Artis DelayNi Estevon Fluke, MD 2/26/201812:37 PM

## 2016-10-28 NOTE — Patient Instructions (Signed)

## 2016-10-28 NOTE — Assessment & Plan Note (Signed)
He has been receiving IVIG treatment in [place] and since he will be in [place] transient[place] for the next 3 months, I will prescribe IVIG for him. He has been receiving Gammagard 40 g IV every 3 weeks without any side effects. I will prescribe the same. He will get premedication with Tylenol and Benadryl before treatment. I have recently obtained insurance prior authorization up until September 2018. We discussed future treatment. I have repeated IgG level today. If his IgG level is adequate, we might have a problem getting insurance authorization for future treatment.  The patient is aware.  I will call him in a few days once I have results available. So far, since he has been receiving chronic IVIG treatment, he denies recurrent infection

## 2016-10-28 NOTE — Assessment & Plan Note (Signed)
Likely related to aging and chronic hypertension.  Continue medical management

## 2016-10-29 ENCOUNTER — Telehealth: Payer: Self-pay | Admitting: Hematology and Oncology

## 2016-10-29 LAB — IGG, IGA, IGM
IGA/IMMUNOGLOBULIN A, SERUM: 98 mg/dL (ref 61–437)
IGG (IMMUNOGLOBIN G), SERUM: 912 mg/dL (ref 700–1600)
IGM (IMMUNOGLOBIN M), SRM: 47 mg/dL (ref 20–172)

## 2016-10-29 NOTE — Telephone Encounter (Signed)
I reviewed the patient's blood test with him. His serum immunoglobulin levels and CBC were within normal limits. The patient is stunned with the results. I have advised him to call his physician in [place] who directed him here for IVIG treatment.  Based on this test results, it is not clear to me that he truly need further IVIG treatment in the future.  The patient will call his physician in [place] to discuss.

## 2016-11-04 ENCOUNTER — Encounter: Payer: Self-pay | Admitting: Hematology and Oncology

## 2016-11-13 ENCOUNTER — Telehealth: Payer: Self-pay | Admitting: Hematology and Oncology

## 2016-11-13 NOTE — Telephone Encounter (Signed)
Confirmed all appointments with patient  °

## 2016-11-18 ENCOUNTER — Ambulatory Visit (HOSPITAL_BASED_OUTPATIENT_CLINIC_OR_DEPARTMENT_OTHER): Payer: Medicare Other

## 2016-11-18 ENCOUNTER — Other Ambulatory Visit (HOSPITAL_BASED_OUTPATIENT_CLINIC_OR_DEPARTMENT_OTHER): Payer: Medicare Other

## 2016-11-18 VITALS — BP 129/61 | HR 80 | Temp 97.2°F | Resp 16

## 2016-11-18 DIAGNOSIS — D839 Common variable immunodeficiency, unspecified: Secondary | ICD-10-CM | POA: Diagnosis present

## 2016-11-18 LAB — CBC WITH DIFFERENTIAL/PLATELET
BASO%: 0.6 % (ref 0.0–2.0)
BASOS ABS: 0 10*3/uL (ref 0.0–0.1)
EOS%: 2.1 % (ref 0.0–7.0)
Eosinophils Absolute: 0.1 10*3/uL (ref 0.0–0.5)
HCT: 43.3 % (ref 38.4–49.9)
HEMOGLOBIN: 14.8 g/dL (ref 13.0–17.1)
LYMPH#: 1.7 10*3/uL (ref 0.9–3.3)
LYMPH%: 35.3 % (ref 14.0–49.0)
MCH: 31.3 pg (ref 27.2–33.4)
MCHC: 34.2 g/dL (ref 32.0–36.0)
MCV: 91.5 fL (ref 79.3–98.0)
MONO#: 0.4 10*3/uL (ref 0.1–0.9)
MONO%: 8 % (ref 0.0–14.0)
NEUT#: 2.6 10*3/uL (ref 1.5–6.5)
NEUT%: 54 % (ref 39.0–75.0)
Platelets: 266 10*3/uL (ref 140–400)
RBC: 4.73 10*6/uL (ref 4.20–5.82)
RDW: 12.2 % (ref 11.0–14.6)
WBC: 4.8 10*3/uL (ref 4.0–10.3)
nRBC: 0 % (ref 0–0)

## 2016-11-18 MED ORDER — DIPHENHYDRAMINE HCL 25 MG PO CAPS
ORAL_CAPSULE | ORAL | Status: AC
  Filled 2016-11-18: qty 1

## 2016-11-18 MED ORDER — GAMMAGARD 10 GM/100ML IJ SOLN
40.0000 g | Freq: Once | INTRAMUSCULAR | Status: AC
  Administered 2016-11-18: 40 g via INTRAVENOUS
  Filled 2016-11-18: qty 400

## 2016-11-18 MED ORDER — DIPHENHYDRAMINE HCL 25 MG PO TABS
25.0000 mg | ORAL_TABLET | Freq: Once | ORAL | Status: AC
  Administered 2016-11-18: 25 mg via ORAL
  Filled 2016-11-18: qty 1

## 2016-11-18 MED ORDER — ACETAMINOPHEN 325 MG PO TABS
650.0000 mg | ORAL_TABLET | Freq: Once | ORAL | Status: AC
  Administered 2016-11-18: 650 mg via ORAL

## 2016-11-18 MED ORDER — ACETAMINOPHEN 325 MG PO TABS
ORAL_TABLET | ORAL | Status: AC
  Filled 2016-11-18: qty 2

## 2016-11-18 MED ORDER — DEXTROSE 5 % IV SOLN
INTRAVENOUS | Status: DC
  Administered 2016-11-18: 11:00:00 via INTRAVENOUS

## 2016-11-18 MED ORDER — IMMUNE GLOBULIN (HUMAN) 10 GM/100ML IV SOLN
40.0000 g | INTRAVENOUS | Status: DC
  Filled 2016-11-18: qty 400

## 2016-11-18 NOTE — Patient Instructions (Signed)

## 2017-01-20 ENCOUNTER — Ambulatory Visit: Payer: Medicare Other

## 2017-01-20 ENCOUNTER — Ambulatory Visit (HOSPITAL_BASED_OUTPATIENT_CLINIC_OR_DEPARTMENT_OTHER): Payer: Medicare Other

## 2017-01-20 VITALS — BP 123/68 | HR 51 | Temp 97.9°F | Resp 16

## 2017-01-20 DIAGNOSIS — D839 Common variable immunodeficiency, unspecified: Secondary | ICD-10-CM

## 2017-01-20 LAB — CBC WITH DIFFERENTIAL/PLATELET
BASO%: 1.9 % (ref 0.0–2.0)
BASOS ABS: 0.1 10*3/uL (ref 0.0–0.1)
EOS%: 1.9 % (ref 0.0–7.0)
Eosinophils Absolute: 0.1 10*3/uL (ref 0.0–0.5)
HCT: 40.2 % (ref 38.4–49.9)
HEMOGLOBIN: 13.4 g/dL (ref 13.0–17.1)
LYMPH#: 1.2 10*3/uL (ref 0.9–3.3)
LYMPH%: 30.5 % (ref 14.0–49.0)
MCH: 31 pg (ref 27.2–33.4)
MCHC: 33.3 g/dL (ref 32.0–36.0)
MCV: 93.1 fL (ref 79.3–98.0)
MONO#: 0.3 10*3/uL (ref 0.1–0.9)
MONO%: 8.8 % (ref 0.0–14.0)
NEUT%: 56.9 % (ref 39.0–75.0)
NEUTROS ABS: 2.2 10*3/uL (ref 1.5–6.5)
NRBC: 0 % (ref 0–0)
Platelets: 208 10*3/uL (ref 140–400)
RBC: 4.32 10*6/uL (ref 4.20–5.82)
RDW: 12.8 % (ref 11.0–14.6)
WBC: 3.8 10*3/uL — ABNORMAL LOW (ref 4.0–10.3)

## 2017-01-20 MED ORDER — ACETAMINOPHEN 325 MG PO TABS: 650.0000 mg | ORAL_TABLET | Freq: Once | ORAL | Status: DC

## 2017-01-20 MED ORDER — IMMUNE GLOBULIN (HUMAN) 10 GM/100ML IV SOLN: 0.5000 g/kg | INTRAVENOUS | Status: DC

## 2017-01-20 MED ORDER — GAMMAGARD 10 GM/100ML IJ SOLN
40.0000 g | Freq: Once | INTRAMUSCULAR | Status: AC
  Administered 2017-01-20: 40 g via INTRAVENOUS
  Filled 2017-01-20: qty 400

## 2017-01-20 MED ORDER — DIPHENHYDRAMINE HCL 25 MG PO TABS
25.0000 mg | ORAL_TABLET | Freq: Once | ORAL | Status: DC
  Filled 2017-01-20: qty 1

## 2017-01-20 MED ORDER — SODIUM CHLORIDE 0.9 % IV SOLN: Freq: Once | INTRAVENOUS | Status: DC

## 2017-01-20 NOTE — Patient Instructions (Signed)

## 2017-02-05 ENCOUNTER — Ambulatory Visit (INDEPENDENT_AMBULATORY_CARE_PROVIDER_SITE_OTHER): Payer: Medicare Other | Admitting: Emergency Medicine

## 2017-02-05 ENCOUNTER — Ambulatory Visit (INDEPENDENT_AMBULATORY_CARE_PROVIDER_SITE_OTHER): Payer: Medicare Other

## 2017-02-05 VITALS — BP 130/60 | HR 54 | Temp 97.5°F | Resp 16 | Ht 66.5 in | Wt 188.4 lb

## 2017-02-05 DIAGNOSIS — M25531 Pain in right wrist: Secondary | ICD-10-CM | POA: Diagnosis not present

## 2017-02-05 DIAGNOSIS — S63501A Unspecified sprain of right wrist, initial encounter: Secondary | ICD-10-CM

## 2017-02-05 MED ORDER — DICLOFENAC SODIUM 75 MG PO TBEC: 75.0000 mg | DELAYED_RELEASE_TABLET | Freq: Two times a day (BID) | ORAL | 0 refills | Status: AC

## 2017-02-05 NOTE — Patient Instructions (Addendum)
Use wrist splint as instructed for 1-2 weeks.  IF you received an x-ray today, you will receive an invoice from [facility]. Please contact [facility] at [phone] with questions or concerns regarding your invoice.   IF you received labwork today, you will receive an invoice from [facility]. Please contact [facility] at [phone] with questions or concerns regarding your invoice.   Our billing staff will not be able to assist you with questions regarding bills from these companies.  You will be contacted with the lab results as soon as they are available. The fastest way to get your results is to activate your My Chart account. Instructions are located on the last page of this paperwork. If you have not heard from [place] regarding the results in 2 weeks, please contact this office.     Wrist Sprain, Adult A wrist sprain is a stretch or tear in the strong, fibrous tissues (ligaments) that connect your wrist bones. There are three types of wrist sprains:  Grade 1. In this type of sprain, the ligament is stretched more than normal.  Grade 2. In this type of sprain, the ligament is partially torn. You may be able to move your wrist, but not very much.  Grade 3. In this type of sprain, the ligament or muscle is completely torn. You may find it difficult or extremely painful to move your wrist even a little.  What are the causes? A wrist sprain can be caused by using the wrist too much during sports, exercise, or at work. It can also happen with a fall or during an accident. What increases the risk? This condition is more likely to occur in people:  With a previous wrist or arm injury.  With poor wrist strength and flexibility.  Who play contact sports, such as football or soccer.  Who play sports that may result in a fall, such as skateboarding, biking, skiing, or snowboarding.  Who do not exercise regularly.  Who use exercise equipment that does not fit  well.  What are the signs or symptoms? Symptoms of this condition include:  Pain in the wrist, arm, or hand.  Swelling or bruised skin near the wrist, hand, or arm. The skin may look yellow or kind of blue.  Stiffness or trouble moving the hand.  Hearing a pop or feeling a tear at the time of the injury.  A warm feeling in the skin around the wrist.  How is this diagnosed? This condition is diagnosed with a physical exam. Sometimes an X-ray is taken to make sure a bone did not break. If your health care provider thinks that you tore a ligament, he or she may order an MRI of your wrist. How is this treated? This condition is treated by resting and applying ice to your wrist. Additional treatment may include:  Medicine for pain and inflammation.  A splint to keep your wrist still (immobilized).  Exercises to strengthen and stretch your wrist.  Surgery. This may be done if the ligament is completely torn.  Follow these instructions at home: If you have a splint:   Do not put pressure on any part of the splint until it is fully hardened. This may take several hours.  Wear the splint as told by your health care provider. Remove it only as told by your health care provider.  Loosen the splint if your fingers tingle, become numb, or turn cold and blue.  If your splint is not waterproof: ? Do not let it  get wet. ? Cover it with a watertight covering when you take a bath or a shower.  Keep the splint clean. Managing pain, stiffness, and swelling   If directed, put ice on the injured area. ? If you have a removable splint, remove it as told by your health care provider. ? Put ice in a plastic bag. ? Place a towel between your skin and the bag or between the splint and the bag. ? Leave the ice on for 20 minutes, 2-3 times per day.  Move your fingers often to avoid stiffness and to lessen swelling.  Raise (elevate) the injured area above the level of your heart while you are  sitting or lying down. Activity  Rest your wrist. Do not do things that cause pain.  Return to your normal activities as told by your health care provider. Ask your health care provider what activities are safe for you.  Do exercises as told by your health care provider. General instructions  Take over-the-counter and prescription medicines only as told by your health care provider.  Do not use any products that contain nicotine or tobacco, such as cigarettes and e-cigarettes. These can delay healing. If you need help quitting, ask your health care provider.  Ask your health care provider when it is safe to drive if you have a splint.  Keep all follow-up visits as told by your health care provider. This is important. Contact a health care provider if:  Your pain, bruising, or swelling gets worse.  Your skin becomes red, gets a rash, or has open sores.  Your pain does not get better or it gets worse. Get help right away if:  You have a new or sudden sharp pain in the hand, arm, or wrist.  You have tingling or numbness in your hand.  Your fingers turn white, very red, or cold and blue.  You cannot move your fingers. This information is not intended to replace advice given to you by your health care provider. Make sure you discuss any questions you have with your health care provider. Document Released: 04/22/2014 Document Revised: 03/16/2016 Document Reviewed: 03/07/2016 [employer] Interactive Patient Education  2017 [employer]

## 2017-02-05 NOTE — Progress Notes (Signed)
Leonard Reid 71 y.o.   Chief Complaint  Patient presents with  . Hand Pain    right x 5 days    HISTORY OF PRESENT ILLNESS: This is a 71 y.o. male complaining of right wrist pain x 5 days; may have injured it while moving luggage from overhead space.  HPI   Prior to Admission medications   Medication Sig Start Date End Date Taking? Authorizing Provider  fluticasone (FLONASE) 50 MCG/ACT nasal spray Place into both nostrils daily.   Yes [provider]  ipratropium (ATROVENT HFA) 17 MCG/ACT inhaler Inhale 2 puffs into the lungs as needed for wheezing.   Yes [provider]  levalbuterol (XOPENEX) 0.31 MG/3ML nebulizer solution Take 1 ampule by nebulization as needed for wheezing.   Yes [provider]  losartan (COZAAR) 50 MG tablet Take 50 mg by mouth daily.   Yes [provider]  Nutritional Supplements (DHEA PO) Take 25 mg by mouth 2 (two) times daily.   Yes [provider]  pantoprazole (PROTONIX) 40 MG tablet Take 40 mg by mouth daily.   Yes [provider]  phenelzine (NARDIL) 15 MG tablet Take 30 mg by mouth 2 (two) times daily.   Yes [provider]  ranitidine (ZANTAC) 150 MG tablet Take 150 mg by mouth daily.   Yes [provider]  testosterone cypionate (DEPOTESTOTERONE CYPIONATE) 100 MG/ML injection Inject 50 mg into the muscle once a week. For IM use only   Yes [provider]  thyroid (ARMOUR) 60 MG tablet Take 60 mg by mouth daily before breakfast.   Yes [provider]  Ergocalciferol (VITAMIN D2 PO) Take 5,000 Units by mouth daily.     [provider]  Levomefolate Glucosamine (METHYLFOLATE PO) Take by mouth.    [provider]  lisdexamfetamine (VYVANSE) 70 MG capsule Take 1 capsule (70 mg total) by mouth daily. Patient not taking: Reported on 01/20/2017 05/16/16   Benjaman Pott, MD  UNABLE TO FIND Testosterone 200mg /g in DMSO, two pumps per site in am and pm     [provider]  vitamin B-12 (CYANOCOBALAMIN) 100 MCG tablet Take 100 mcg by mouth daily.    [provider]    No Known Allergies  Patient Active Problem List   Diagnosis Date Noted  . Chronic kidney disease, stage III (moderate) 10/28/2016  . 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 Severe recurrent major depression without psychotic features (HCC) 08/02/2015    Class: Chronic    Past Medical History:  Diagnosis Date  . 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 Hypertension   . Hypogonadism in male   . S/P bronchoscopy   . Thyroid disease     Past Surgical History:  Procedure Laterality Date  . APPENDECTOMY    . HERNIA REPAIR    . laminectomy    . MENISCUS REPAIR      Social History   Social History  . Marital status: Married    Spouse name: N/A  . Number of children: N/A  . Years of education: N/A   Occupational History  . Not on file.   Social History Main Topics  . Smoking status: Never Smoker  . Smokeless tobacco: Never Used  . Alcohol use 0.6 oz/week    1 Glasses of wine per week  . Drug use: No  . Sexual activity: Not on file   Other Topics Concern  . Not on file   Social History Narrative  .  No narrative on file    Family History  Problem Relation Age of Onset  . Cancer Father        esophageal ca     Review of Systems  Constitutional: Negative.  Negative for chills and fever.  Respiratory: Negative for shortness of breath.   Cardiovascular: Negative for chest pain.  Gastrointestinal: Negative for nausea and vomiting.  Genitourinary: Negative for dysuria and hematuria.  Musculoskeletal: Positive for joint pain (wrist pain).  Skin: Negative for rash.  Neurological: Negative for dizziness, sensory change, focal weakness and headaches.  Endo/Heme/Allergies: Negative.   All other systems reviewed and are negative.  Vitals:   02/05/17 1409 02/05/17 1452  BP: (!) 146/77 130/60    Pulse: (!) 54   Resp: 16   Temp: 97.5 F (36.4 C)     Physical Exam  Constitutional: He is oriented to person, place, and time. He appears well-developed and well-nourished.  HENT:  Head: Normocephalic and atraumatic.  Eyes: EOM are normal. Pupils are equal, round, and reactive to light.  Neck: Normal range of motion. Neck supple.  Cardiovascular: Normal rate.   Pulmonary/Chest: Effort normal.  Musculoskeletal:  Right wrist: LROM due to pain; no erythema, mild swelling, no bruising, some ulnar tenderness; NVI.  Neurological: He is alert and oriented to person, place, and time.  Skin: Skin is warm. Capillary refill takes less than 2 seconds.  Psychiatric: He has a normal mood and affect. His behavior is normal.  Vitals reviewed.  Dg Wrist Complete Right  Result Date: 02/05/2017 CLINICAL DATA:  Right wrist pain with no known injury. EXAM: RIGHT WRIST - COMPLETE 3+ VIEW COMPARISON:  None. FINDINGS: There is no evidence of fracture or dislocation. There is no evidence of arthropathy or other focal bone abnormality. Soft tissues are unremarkable. IMPRESSION: Negative. Electronically Signed   By: Marnee Spring M.D.   On: 02/05/2017 14:53     ASSESSMENT & PLAN: Leonard Reid was seen today for hand pain.  Diagnoses and all orders for this visit:  Right wrist pain -     DG Wrist Complete Right; Future  Sprain of right wrist, initial encounter  Other orders -     diclofenac (VOLTAREN) 75 MG EC tablet; Take 1 tablet (75 mg total) by mouth 2 (two) times daily.    Patient Instructions    Use wrist splint as instructed for 1-2 weeks.  IF you received an x-ray today, you will receive an invoice from [facility]. Please contact [facility] at [phone] with questions or concerns regarding your invoice.   IF you received labwork today, you will receive an invoice from [facility]. Please contact [facility] at [phone] with questions or concerns regarding your invoice.    Our billing staff will not be able to assist you with questions regarding bills from these companies.  You will be contacted with the lab results as soon as they are available. The fastest way to get your results is to activate your My Chart account. Instructions are located on the last page of this paperwork. If you have not heard from [place] regarding the results in 2 weeks, please contact this office.     Wrist Sprain, Adult A wrist sprain is a stretch or tear in the strong, fibrous tissues (ligaments) that connect your wrist bones. There are three types of wrist sprains:  Grade 1. In this type of sprain, the ligament is stretched more than normal.  Grade 2. In this type of sprain, the ligament is partially torn.  You may be able to move your wrist, but not very much.  Grade 3. In this type of sprain, the ligament or muscle is completely torn. You may find it difficult or extremely painful to move your wrist even a little.  What are the causes? A wrist sprain can be caused by using the wrist too much during sports, exercise, or at work. It can also happen with a fall or during an accident. What increases the risk? This condition is more likely to occur in people:  With a previous wrist or arm injury.  With poor wrist strength and flexibility.  Who play contact sports, such as football or soccer.  Who play sports that may result in a fall, such as skateboarding, biking, skiing, or snowboarding.  Who do not exercise regularly.  Who use exercise equipment that does not fit well.  What are the signs or symptoms? Symptoms of this condition include:  Pain in the wrist, arm, or hand.  Swelling or bruised skin near the wrist, hand, or arm. The skin may look yellow or kind of blue.  Stiffness or trouble moving the hand.  Hearing a pop or feeling a tear at the time of the injury.  A warm feeling in the skin around the wrist.  How is this diagnosed? This condition is diagnosed  with a physical exam. Sometimes an X-ray is taken to make sure a bone did not break. If your health care provider thinks that you tore a ligament, he or she may order an MRI of your wrist. How is this treated? This condition is treated by resting and applying ice to your wrist. Additional treatment may include:  Medicine for pain and inflammation.  A splint to keep your wrist still (immobilized).  Exercises to strengthen and stretch your wrist.  Surgery. This may be done if the ligament is completely torn.  Follow these instructions at home: If you have a splint:   Do not put pressure on any part of the splint until it is fully hardened. This may take several hours.  Wear the splint as told by your health care provider. Remove it only as told by your health care provider.  Loosen the splint if your fingers tingle, become numb, or turn cold and blue.  If your splint is not waterproof: ? Do not let it get wet. ? Cover it with a watertight covering when you take a bath or a shower.  Keep the splint clean. Managing pain, stiffness, and swelling   If directed, put ice on the injured area. ? If you have a removable splint, remove it as told by your health care provider. ? Put ice in a plastic bag. ? Place a towel between your skin and the bag or between the splint and the bag. ? Leave the ice on for 20 minutes, 2-3 times per day.  Move your fingers often to avoid stiffness and to lessen swelling.  Raise (elevate) the injured area above the level of your heart while you are sitting or lying down. Activity  Rest your wrist. Do not do things that cause pain.  Return to your normal activities as told by your health care provider. Ask your health care provider what activities are safe for you.  Do exercises as told by your health care provider. General instructions  Take over-the-counter and prescription medicines only as told by your health care provider.  Do not use any  products that contain nicotine or tobacco, such as cigarettes and  e-cigarettes. These can delay healing. If you need help quitting, ask your health care provider.  Ask your health care provider when it is safe to drive if you have a splint.  Keep all follow-up visits as told by your health care provider. This is important. Contact a health care provider if:  Your pain, bruising, or swelling gets worse.  Your skin becomes red, gets a rash, or has open sores.  Your pain does not get better or it gets worse. Get help right away if:  You have a new or sudden sharp pain in the hand, arm, or wrist.  You have tingling or numbness in your hand.  Your fingers turn white, very red, or cold and blue.  You cannot move your fingers. This information is not intended to replace advice given to you by your health care provider. Make sure you discuss any questions you have with your health care provider. Document Released: 04/22/2014 Document Revised: 03/16/2016 Document Reviewed: 03/07/2016 Elsevier Interactive Patient Education  2017 Elsevier Inc.      Edwina BarthMiguel Jovontae Banko, MD [facility]

## 2017-02-10 ENCOUNTER — Ambulatory Visit: Payer: Medicare Other

## 2017-02-10 ENCOUNTER — Other Ambulatory Visit: Payer: Medicare Other

## 2017-02-14 ENCOUNTER — Encounter: Payer: Self-pay | Admitting: Hematology and Oncology

## 2017-02-17 ENCOUNTER — Encounter: Payer: Self-pay | Admitting: Hematology and Oncology

## 2017-03-27 ENCOUNTER — Encounter: Payer: Self-pay | Admitting: Hematology and Oncology

## 2017-06-06 ENCOUNTER — Encounter: Payer: Self-pay | Admitting: Physician Assistant

## 2017-06-06 ENCOUNTER — Ambulatory Visit (INDEPENDENT_AMBULATORY_CARE_PROVIDER_SITE_OTHER): Payer: Medicare Other | Admitting: Physician Assistant

## 2017-06-06 VITALS — BP 110/62 | HR 65 | Temp 97.6°F | Resp 16 | Ht 66.5 in | Wt 180.0 lb

## 2017-06-06 DIAGNOSIS — K6289 Other specified diseases of anus and rectum: Secondary | ICD-10-CM

## 2017-06-06 MED ORDER — METRONIDAZOLE 1 % EX GEL: Freq: Every day | CUTANEOUS | 0 refills | Status: AC

## 2017-06-06 MED ORDER — AMOXICILLIN-POT CLAVULANATE 875-125 MG PO TABS: 1.0000 | ORAL_TABLET | Freq: Two times a day (BID) | ORAL | 0 refills | Status: AC

## 2017-06-06 NOTE — Patient Instructions (Signed)
     IF you received an x-ray today, you will receive an invoice from [facility]. Please contact [facility] at [phone] with questions or concerns regarding your invoice.   IF you received labwork today, you will receive an invoice from [facility]. Please contact [facility] at [phone] with questions or concerns regarding your invoice.   Our billing staff will not be able to assist you with questions regarding bills from these companies.  You will be contacted with the lab results as soon as they are available. The fastest way to get your results is to activate your My Chart account. Instructions are located on the last page of this paperwork. If you have not heard from us regarding the results in 2 weeks, please contact this office.     

## 2017-06-06 NOTE — Progress Notes (Signed)
    06/06/2017 3:28 PM   DOB: [birth year] / MRN: [id number]  SUBJECTIVE:  Leonard Reid is a 71 y.o. male presenting for rectal irritation. Tells me this was diagnosed as a pilonidal cyst by a surgeon in [place] and he will be getting surgery on November the 2nd.  Has been be on abx and this includes Flagyl. Had a colonoscopy about three years ago.   He has No Known Allergies.   He  has a past medical history of CVID (common variable immunodeficiency) (HCC) (10/26/2015); Hypertension; Hypogonadism in male; S/P bronchoscopy; and Thyroid disease.    He  reports that he has never smoked. He has never used smokeless tobacco. He reports that he drinks about 0.6 oz of alcohol per week . He reports that he does not use drugs. He  has no sexual activity history on file. The patient  has a past surgical history that includes Hernia repair; Meniscus repair; laminectomy; and Appendectomy.  His family history includes Cancer in his father.  Review of Systems  Constitutional: Negative for chills, diaphoresis and fever.  Respiratory: Negative for shortness of breath.   Cardiovascular: Negative for chest pain, orthopnea and leg swelling.  Gastrointestinal: Negative for constipation, diarrhea and nausea.  Genitourinary: Negative for dysuria, frequency and urgency.  Skin: Negative for rash.  Neurological: Negative for dizziness.    The problem list and medications were reviewed and updated by myself where necessary and exist elsewhere in the encounter.   OBJECTIVE:  BP 110/62 (BP Location: Right Arm, Patient Position: Sitting, Cuff Size: Large)   Pulse 65   Temp 97.6 F (36.4 C) (Oral)   Resp 16   Ht 5' 6.5" (1.689 m)   Wt 180 lb (81.6 kg)   SpO2 98%   BMI 28.62 kg/m   Physical Exam  Constitutional: He appears well-developed. He is active and cooperative.  Non-toxic appearance.  Cardiovascular: Normal rate.   Pulmonary/Chest: Effort normal. No tachypnea.  Genitourinary:     Neurological:  He is alert.  Skin: Skin is warm and dry. He is not diaphoretic. No pallor.  Vitals reviewed.   No results found for this or any previous visit (from the past 72 hour(s)).  No results found.  Lab Results  Component Value Date   CREATININE 1.5 (H) 10/28/2016   BUN 20.1 10/28/2016   NA 137 10/28/2016   K 4.1 10/28/2016   CO2 25 10/28/2016     ASSESSMENT AND PLAN:  Leonard Reid was seen today for pruritis.  Diagnoses and all orders for this visit:  Rectal irritation: Thought to be 2/2 to a pilonidal.  He has surgery planned on the 2nd of next month.  Pain is getting worse.  Will cover for worsening infection.  -     amoxicillin-clavulanate (AUGMENTIN) 875-125 MG tablet; Take 1 tablet by mouth 2 (two) times daily. Discuss with pharmacist. -     metroNIDAZOLE (METROGEL) 1 % gel; Apply topically daily.    The patient is advised to call or return to clinic if he does not see an improvement in symptoms, or to seek the care of the closest emergency department if he worsens with the above plan.   Deliah Boston, MHS, PA-C [facility] 06/06/2017 3:28 PM

## 2018-01-23 IMAGING — DX DG CHEST 2V
2 series · 2 of 2 positions shown · non-contrast
Comparison: None.

CLINICAL DATA: Chest tightness and nonproductive cough.

EXAM:
CHEST  2 VIEW

[chest pa]
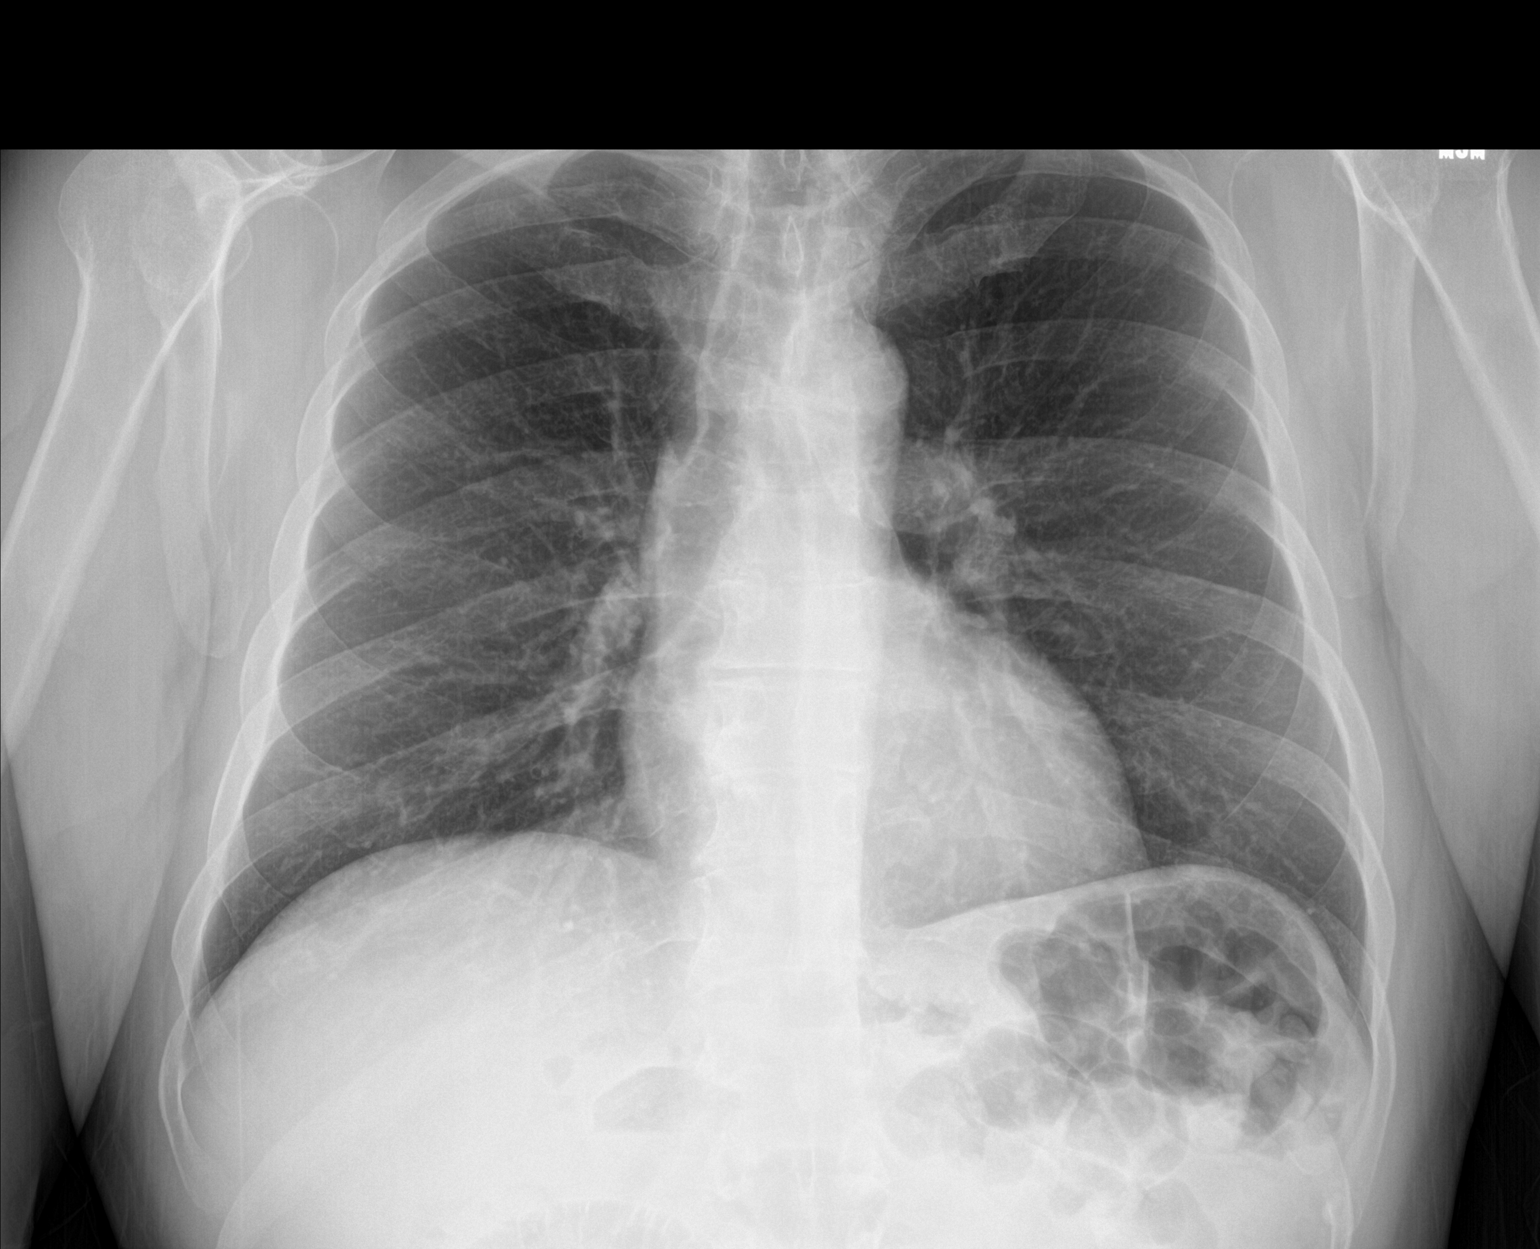

[chest lat]
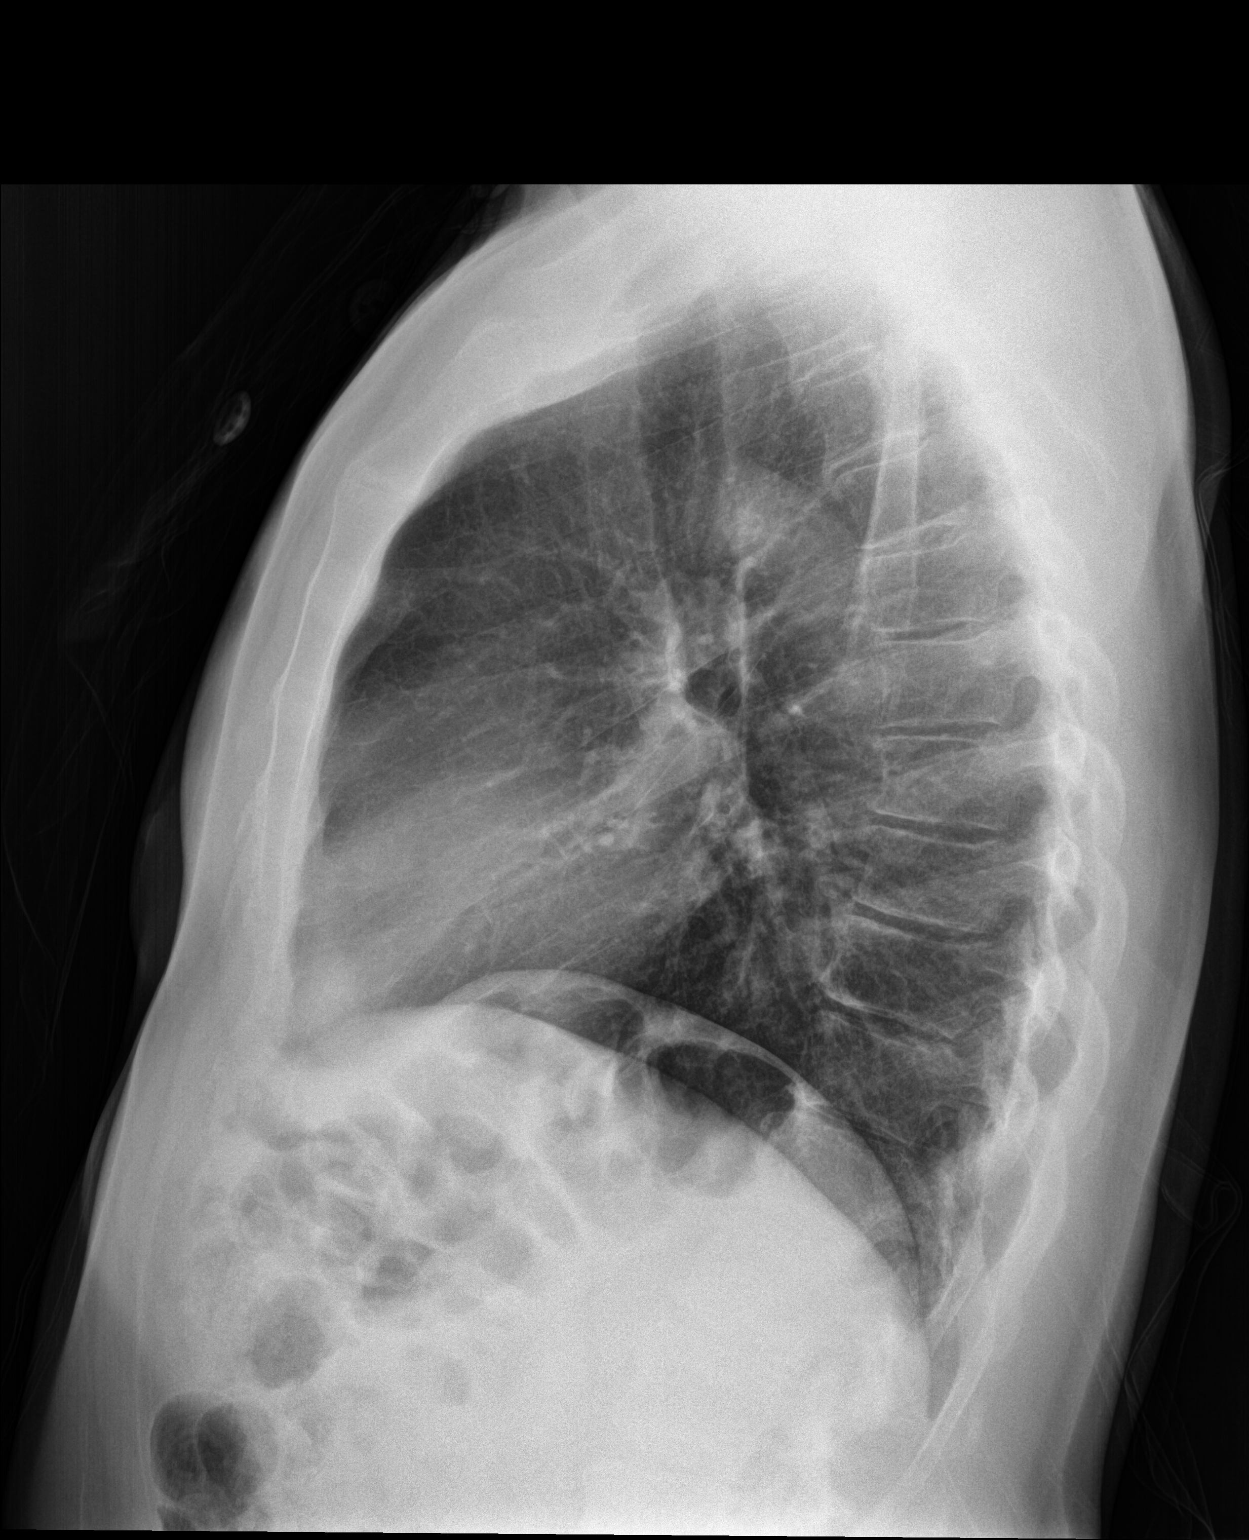

[2 of 2 positions shown; findings below may reference images not displayed]

FINDINGS: The heart size and mediastinal contours are within normal limits.
Both lungs are clear. The visualized skeletal structures are
unremarkable.
IMPRESSION: Normal chest x-ray.

## 2018-04-09 ENCOUNTER — Telehealth

## 2018-04-09 NOTE — Telephone Encounter
Spoke with health insurance company to verify patient's benefits. Left message for patient explaining their health coverage for [facility] treatments.

## 2018-04-27 ENCOUNTER — Ambulatory Visit: Attending: Student in an Organized Health Care Education/Training Program

## 2018-04-27 DIAGNOSIS — G8929 Other chronic pain: Secondary | ICD-10-CM

## 2018-04-27 DIAGNOSIS — M545 Low back pain: Secondary | ICD-10-CM

## 2018-04-27 DIAGNOSIS — M542 Cervicalgia: Secondary | ICD-10-CM

## 2018-04-27 DIAGNOSIS — S46812S Strain of other muscles, fascia and tendons at shoulder and upper arm level, left arm, sequela: Secondary | ICD-10-CM

## 2018-04-27 NOTE — Patient Instructions
Stretches daily.    Massage with a  tennis ball on your back daily and golf ball on your feet daily.    Heat 15-30 minutes daily.    Please avoid overloading, but maintain regular physical activity.

## 2018-04-27 NOTE — Consults
[facility] Consult Note    PATIENT: Darius Mcguire  MRN: [id number]  DOB: Oct 08, 1945  DATE OF SERVICE: 04/27/2018    REFERRING PRACTITIONER: Darius Rinks, MD  PRIMARY CARE PROVIDER: Linzie Collin, MD    Chief Complaint   Patient presents with   ??? Neck Pain   ??? Shoulder Pain     Left Shoulder   ??? Back Pain     Lower Back       Subjective:      History of Present Illness:  Darius Mcguire is a 72 y.o. male who presents for neck/shoulder pain L>R and LBP.    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 Did it in the past and has gotten back into it in the past 8 months. The last 6 months, notes some pain in the low back and neck/shoulders. Has been trying to strengthen his core and upper back. Notes that archery is ''back tension and release'' technique. The idea is to ''draw the scapulas together and the fingers release the string.'' notes that his sxs started about 6 months ago. Notes that he does a ''whole series of stretching exercises that he received thru PT.'' currently in PT--going back in a month. also does a lot of stretching/strengthening that archers will do. Does push ups and sit ups. Seen by ortho (Dr Darius Mcguire). Not currently using heat or ice.    Notes that he has MDD and fatigue. Did acupuncture for 6 months about 1 year ago for MDD. Also did TCM herbs as well. Notes that it wasn't helpful. 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 Has tried psilocybin, ketamine, TMS, and all other usual medications. Nothing was helpful.    At the beginning of the visit, asked if I had a [place] name because he ''liked the way they sound.'' When discussing archery as above, pt named a place that was good to learn archery and offered to meet me there to teach me. I politely declined. Pt requested that I call him Darius Mcguire.     There is no problem list on file for this patient.      No past medical history on file.    No past surgical history on file.    No family history on file.    Social History Social History   ??? Marital status: Married     Spouse name: N/A   ??? Number of children: N/A   ??? Years of education: N/A     Occupational History   ??? Not on file.     Social History Main Topics   ??? Smoking status: Never Smoker   ??? Smokeless tobacco: Never Used   ??? Alcohol use Not on file   ??? Drug use: Unknown   ??? Sexual activity: Not on file     Other Topics Concern   ??? Not on file     Social History Narrative   ??? No narrative on file       .  Medications that the patient states to be currently taking   Medication Sig   ??? cholecalciferol 1000 units tablet Take by mouth.   ??? cyanocobalamin 100 mcg tablet Take 100 mcg by mouth.   ??? DEXILANT 60 MG DR capsule TAKE 1 CAPSULE BY MOUTH TWICE A DAY   ??? DHEA 25 MG CAPS Take 25 mg by mouth.   ??? losartan 100 mg tablet TAKE 1 TABLET DAILY   ??? thyroid (ARMOUR THYROID) 60 mg tablet  TAKE 1 TABLET EVERY MORNINGON AN EMPTY STOMACH   ??? traZODone 100 mg tablet TAKE 2 TABLETS BY MOUTH AT BEDTIME       Allergies   Allergen Reactions   ??? Cat Hair Extract Other (See Comments)         Review of Systems:    A 14 point review of systems was completed on intake.  Additional information is summarized below:   please see HPI otherwise negative.             Objective:        Vital Signs:  BP 130/72  ~ Pulse 52  ~ Temp 36.4 ???C (97.6 ???F)  ~ Ht 5' 8'' (1.727 m)  ~ Wt 170 lb (77.1 kg)  ~ BMI 25.85 kg/m???     Physical Exam:  General: alert, appears stated age and cooperative  Eyes: conjunctiva and lids normal  ENMT: normal external nose and ears.  normal oropharynx  Neck: supple, full range of motion  Lungs:normal respiratory effort.  no cyanosis.  Heart:  regular rate and rhythm.  Abdomen: soft, non-tender  Extremities:extremities normal, atraumatic, no cyanosis or edema  Skin: warm and dry, no rashes.  Neuro: normal sensation, no focal motor deficits.  Psych: alert and oriented to person, place and time, normal memory.  Musculoskeletal:  normal gait/station Trigger/ Tender Points Identification: (see annotated picture)            TCM Tongue Characteristics:  Dusky, red tip and lateral edges, swollen and scalloped, crack down the midline, engorged vessels on the underside.    Tests/Imaging Reviewed:    Patient's Old records and labs reviewed by physician.       Assessment/Impression:   Holdyn Poyser is a 72 y.o. male who presents for neck/shoulder pain L>R, low back pain. Myofascial pain, overloading, MDD are contributing to sxs.       1. Neck pain  Acupuncture    Inject TendonSheath/Ligament BP   2. Strain of left trapezius muscle, sequela  Acupuncture    Inject TendonSheath/Ligament BP   3. Chronic bilateral low back pain without sciatica  Acupuncture    Inject TendonSheath/Ligament BP          Plan/ Recommendation/Education:      Plan:       Acupuncture Points:  (UB 41-42, UB 22-25)       SI-11 UB-60, UB-40   GB-21, GB-20       Acupuncture Treatment Duration: 20 Minutes  Injection of : Tendon Sheath with 0.80mL 1% lidocaine     Splenius capitis, Trapezius Infraspinatus   Presacral fascia             Trial of acupuncture+TPIs, 6-8 treatments.    Stretches daily.    Massage with a  tennis ball on your back daily and golf ball on your feet daily.    Heat 15-30 minutes daily.    Please avoid overloading, but maintain regular physical activity.      Return to Clinic:Return in about 2 weeks (around 05/11/2018) for Darius Mcguire/Acupuncturist.          Spent 50 minutes face to face, 85% of visit counseling and coordination of care re: neck/shoulder pain and low back pain.    Author:  Carlisle Mcguire. Cathie Hoops 04/27/2018 10:57 AM

## 2019-10-14 ENCOUNTER — Ambulatory Visit: Payer: MEDICARE

## 2019-10-14 DIAGNOSIS — Z23 Encounter for immunization: Secondary | ICD-10-CM

## 2023-10-14 ENCOUNTER — Telehealth: Payer: BLUE CROSS/BLUE SHIELD

## 2023-10-14 NOTE — Telephone Encounter
Call Back Request      Reason for call back: Patient is asking to please have a call back in regards to a referral that was faxed over for him to see Dr. Shelva Majestic. Please advise.     Any Symptoms:  []  Yes  [x]  No      If yes, what symptoms are you experiencing:    Duration of symptoms (how long):    Have you taken medication for symptoms (OTC or Rx):      If call was taken outside of clinic hours:    [] Patient or caller has been notified that this message was sent outside of normal clinic hours.     [] Patient or caller has been warm transferred to the physician's answering service. If applicable, patient or caller informed to please call us back if symptoms progress.  Patient or caller has been notified of the turnaround time of 1-2 business day(s).

## 2023-10-21 ENCOUNTER — Telehealth: Payer: BLUE CROSS/BLUE SHIELD

## 2023-10-21 NOTE — Telephone Encounter
 Left message regarding scheduling Mohs w/Dr. Rejeana Brock.  Outside pathology report received.

## 2023-10-21 NOTE — Telephone Encounter
 Call Back Request      Reason for call back: Pt states he received a missed call from clinic to schedule MOHS with Dr.Beynet. Please contact pt to assist, thank you.    Any Symptoms:  []  Yes  [x]  No      If yes, what symptoms are you experiencing:    Duration of symptoms (how long):    Have you taken medication for symptoms (OTC or Rx):      If call was taken outside of clinic hours:    [] Patient or caller has been notified that this message was sent outside of normal clinic hours.     [] Patient or caller has been warm transferred to the physician's answering service. If applicable, patient or caller informed to please call us back if symptoms progress.  Patient or caller has been notified of the turnaround time of 1-2 business day(s).

## 2023-10-21 NOTE — Telephone Encounter
 Call Back Request      Reason for call back:   Pt was speaking to [facility] regarding scheduling, requesting to speak to her again. Dr. Shelva Majestic pt.    Any Symptoms:  []  Yes  [x]  No      If yes, what symptoms are you experiencing:    Duration of symptoms (how long):    Have you taken medication for symptoms (OTC or Rx):      If call was taken outside of clinic hours:    [] Patient or caller has been notified that this message was sent outside of normal clinic hours.     [] Patient or caller has been warm transferred to the physician's answering service. If applicable, patient or caller informed to please call us back if symptoms progress.  Patient or caller has been notified of the turnaround time of 1-2 business day(s).

## 2023-10-21 NOTE — Telephone Encounter
 [facility] Pre-Operative Surgical Outreach: Spoke with patient and went over pre-op instructions as listed below.    Please arrive 15 minutes prior to your appointment time. Please allow anywhere from 1- 4 hours for the procedure. We suggest that you have a big breakfast and bring something to keep yourself entertained (i.e. book, tablet, laptop etc). I will provide more information regarding the surgery below.     Mohs Micrographic Surgery  Mohs micrographic surgery (known as MMS or Mohs surgery) mainly treats the most common forms of skin cancer: basal cell carcinoma and squamous cell carcinoma. Mohs surgery enables your surgeon to remove tissue in stages, precisely identifying and removing tumor cells while preserving the healthy surrounding tissue. Cure rates for the Mohs technique approach 98 percent, the highest of all treatments for skin cancer.    Our fellowship-trained dermatologic surgeons perform Mohs surgery in an outpatient setting. In many cases, patients need only two or three layers. Some may require more treatments to completely remove cancer cells and possibly repair the surgical wound.    As with any surgical procedure, Mohs surgery leaves a scar. After the entire tumor is removed, your physician evaluates the wound and discusses treatment options with you, including:    Allowing the wound to heal naturally  Repairing the wound through surgery    POST OP SUPPLIES:     mild soap (dove, antibacterial, etc)  Aquaphor or Vaseline  Telfa pads or non-adherent dressing   Gauze  Paper tape  Cotton swabs

## 2023-10-22 NOTE — Telephone Encounter
 Spoke with patient. He is currently on antibiotics and wants to know if Dr. Shelva Majestic would like to proceed with Mohs tomorrow. Message sent to Dr. Shelva Majestic.

## 2023-10-23 ENCOUNTER — Ambulatory Visit: Payer: BLUE CROSS/BLUE SHIELD

## 2023-11-05 ENCOUNTER — Telehealth: Payer: BLUE CROSS/BLUE SHIELD

## 2023-11-05 ENCOUNTER — Ambulatory Visit: Payer: BLUE CROSS/BLUE SHIELD

## 2023-11-05 DIAGNOSIS — C44321 Squamous cell carcinoma of skin of nose: Secondary | ICD-10-CM

## 2023-11-05 MED ORDER — MUPIROCIN 2 % EX OINT
Freq: Two times a day (BID) | TOPICAL | 1 refills | Status: AC
Start: 2023-11-05 — End: ?

## 2023-11-05 NOTE — Patient Instructions
 Patient given verbal and paper wound care instructions. Patient expressed understanding.     Post-Operative Wound Care Instructions  [facility]  [facility]  [address]  [address]  [place] [address]  Dept: [phone]  Loc: [phone]      For the first 24 hours after surgery, do not get the wound wet and keep the dressing as it is, do not remove.  After 24 hours, you may get your wound wet (i.e. take a shower, etc.). At this point you must start changing your wound dressing 2 times a day:   Carefully remove the old dressing.  Cleanse wound with a mild soap and water.   Dry wound with gauze and apply Mupirocin ointment using cotton swabs.  Cover with Telfa pads  (non-stick Dressing or non-adherent Dressing)  cut to size. If there is any oozing/draining you may also add some gauze cut to size.  Then secure the dressing in place with paper tape.    Take Tylenol (acetominophen) for any discomfort. Avoid Aspirin (acetylsalicylic acid) or any product containing Aspirin (Anacin, etc.). Avoid alcohol for 5 days post-operatively.  If any bleeding should occur, apply 20 minutes of constant pressure. If the situation persists, please call us immediately.   For several days after surgery, local swelling and drainage of clear or blood-tinged fluid from the wound may occur. If there is persisting noticeable redness, swelling, pain and/or pus after 3 to 4 days, you may have an infection. Please don?t hesitate to call us.  Avoid any strenuous exercise or activity (i.e. bending, lifting heavy objects) that could be harmful to the wound for at least three weeks.  If surgery was performed around the lips or cheeks, minimize activity for 2-3 weeks. (i.e. excessive laughing, smiling, eating any hard foods (steak, apples, etc.) should be avoided)  If surgery was performed on lower extremities, the leg needs to be elevated as much as possible. Activity and weight bearing the feet should be minimized.  Avoid lying or sleeping on the side where surgery was performed.    When shaving, leave your bandage on and shave around it.  If you shave over the sutures, you will cut them and risk the wound opening and/or infection     Wound care:    7 Days    List of Postoperative Antibiotic  Topical: Mupirocin          Nights and weekends, Contact the Mohs  Micrographic Surgery and Dermatologic Oncology Fellow  Trevor Iha, MD   Pager [phone]  [facility] pager [phone]

## 2023-11-05 NOTE — Progress Notes
 [facility]    Patient name: Darius Mcguire  MRN: [id number]  Date of service: 11/05/2023      A) PROCEDURE NOTE    Pre-op Diagnosis:  Squamous cell carcinoma      Anatomic Location: nasal dorsum      Pre-op Size: 0.7 cm x 0.7 cm    Post-op Diagnosis:  Squamous cell carcinoma      Post-op Size: 0.9 cm x 0.9 cm    Procedure:  Surgical excision of a squamous cell carcinoma with continuous microscopic control (Mohs micrographic technique)    Surgeon: Darius Mcguire, M.D.      Assistant surgeon: Darius Mcguire, M.D.     Anesthesia:  Lidocaine 2% with epinephrine     Indications:  Because of the pathology and location of the tumor, Mohs micrographic surgery is indicated to allow for complete removal of the skin tumor with maximal preservation of tissue and to minimize risk for recurrence.      Preparation:  The diagnosis, procedure, risks, benefits and alternative procedures, and consequence of refusal of treatment were discussed with the patient prior to the administration of any medication.  All of the patient?s questions were answered.  Proper informed consent for the surgery and photography were obtained.    Procedure: The procedure consisted of excision of  layers of tissue containing tumor and systematic examination of frozen sections.  This enables [place] to trace out the tumor extensions, sacrificing the minimal amount of normal tissue and, at the same time, achieving optimal cure rate.  At each stage, the patient was placed in the supine position, and was prepped and draped in the usual manner.  The area to be excised was outlined and was anesthetized with local anesthesia.    Stage I: Curettage was performed to further delineate extent of the tumor margins and surgically debulk the tumor.  A layer of tissue encompassing the previously debulked area was surgically excised in a tangential plane with a No. 15 blade. Hemostasis was achieved with a hyfrecator, and a dry pressure dressing was used to cover the surgical defect.      A reference map was drawn to indicate the orientation of the excised tissue.  The surgical specimen was cut into sections, and the edges of each section were colored with separated dyes so that the precise orientation was achieved.  Each section was numbered and each number corresponded to a section on the map. One tissue section was frozen, processed in accordance with the Mohs histological technique, and stained with hematoxylin and eosin.    The prepared microscopic sections were examined.  Evaluation of the microscopic sections revealed tumor-free margins had been achieved.    Estimated Blood Loss: Minimal    Complications: None    Condition of Patient After Surgery: Satisfactory     Disposition of Surgical Defect:  It was determined that the wound site would be best managed by complex repair.    Disposition of Patient:  The patient was discharged to home with written postoperative dressing instructions as well as doctor's [facility] On Call Service pager number and instructed to return to our office prn.     Postoperative Medications: Mupirocin 2% Ointment BID      B) PROCEDURE NOTE    1) Post-operative diagnosis: Squamous cell carcinoma  2) Mohs micrographic surgery defect, nasal dorsum, measuring 0.9 cm x 0.9 cm; Maximum width of the defect perpendicular to the line of closure 0.9 cm.  3) History of squamous cell carcinoma  Procedure:  Primary multilayered complex closure of Mohs surgery defect.    Surgeon: Darius Mcguire, M.D.     Assistant Surgeon: Darius Mcguire, M.D.     Indication: The patient underwent Mohs surgery earlier today and now presents for closure of the wound.  The nature of the procedure, risks, benefits, and alternative procedures,and consequence of refusal of treatment were discussed with the patient.  All of the patient?s questions were addressed.  The patient understands and has agreed to proceed with the procedure.  Informed consent for the surgery and photography was obtained.       Local Anesthesia: 2% lidocaine with epinephrine    Details of Procedure: The patient was prepped and draped in usual fashion.  Local anesthesia was infiltrated into the skin adjacent to the Mohs surgery defect.  After adequate anesthesia was given, vertical realignment of the wound edges was achieved and wound edges undermined.  The defect was converted into an ellipse and standing cones excised superior to inferior.  Complete hemostasis was achieved.  Wound edges were closed in a layered fashion using subcutaneous 5-0 Monocryl sutures followed by superficial 5-0 Fast Absorbing Gut sutures. The final length of the wound measured 3.0 cm.  The patient tolerated procedure well.  A pressure dressing was applied.      Complication(s): None    Estimated Blood Loss: Minimal     Disposition of patient: Wound care was discussed and post-operative written instructions given to the patient.      Darius Mcguire, M.D.   Division of Dermatology     Attestation:  I, Darius Mcguire have assisted Darius Mcguire, M.D., with the documentation for Darius Mcguire on 11/05/2023 at 11:22 AM.     I have reviewed this note and attest that it is an accurate representation of the events of the outpatient visit. Darius Mcguire, M.D., 11/05/2023 11:22 AM

## 2023-11-05 NOTE — Telephone Encounter
 [facility] Call to Clinic    Reason for Call:pt has MOHS procedure completed today and states he is in a lot of pain. Pt has taken a Tylenol an hour ago and has no relief from pain yet.     Appointment Related?  [x]  Yes  []  No     If yes;  Date:11/05/23  Time:10:30 am    Call warm transferred to [facility]: []  Yes  [x]  No    Call Received by Clinic Representative:    If call not answered/not accepted, call received by Patient Services Representative:Tobi Bastos
